# Patient Record
Sex: Female | Born: 1960
Health system: Southern US, Community
[De-identification: ages and names within clinical notes are randomized; demographics above are authoritative.]

## PROBLEM LIST (undated history)

## (undated) DIAGNOSIS — E039 Hypothyroidism, unspecified: Secondary | ICD-10-CM

## (undated) DIAGNOSIS — Z9889 Other specified postprocedural states: Secondary | ICD-10-CM

## (undated) DIAGNOSIS — Z8742 Personal history of other diseases of the female genital tract: Secondary | ICD-10-CM

## (undated) HISTORY — PX: CERVICAL POLYPECTOMY: SHX88

---

## 1999-04-12 ENCOUNTER — Other Ambulatory Visit: Admission: RE | Admit: 1999-04-12 | Discharge: 1999-04-12 | Payer: Self-pay | Admitting: Family Medicine

## 1999-05-01 ENCOUNTER — Ambulatory Visit (HOSPITAL_COMMUNITY): Admission: RE | Admit: 1999-05-01 | Discharge: 1999-05-01 | Payer: Self-pay | Admitting: Family Medicine

## 1999-05-01 ENCOUNTER — Encounter: Payer: Self-pay | Admitting: Family Medicine

## 2000-05-27 ENCOUNTER — Other Ambulatory Visit: Admission: RE | Admit: 2000-05-27 | Discharge: 2000-05-27 | Payer: Self-pay | Admitting: Family Medicine

## 2003-12-02 ENCOUNTER — Other Ambulatory Visit: Admission: RE | Admit: 2003-12-02 | Discharge: 2003-12-02 | Payer: Self-pay | Admitting: Family Medicine

## 2003-12-27 ENCOUNTER — Ambulatory Visit (HOSPITAL_COMMUNITY): Admission: RE | Admit: 2003-12-27 | Discharge: 2003-12-27 | Payer: Self-pay | Admitting: Family Medicine

## 2005-01-12 ENCOUNTER — Other Ambulatory Visit: Admission: RE | Admit: 2005-01-12 | Discharge: 2005-01-12 | Payer: Self-pay | Admitting: Family Medicine

## 2006-03-15 ENCOUNTER — Other Ambulatory Visit: Admission: RE | Admit: 2006-03-15 | Discharge: 2006-03-15 | Payer: Self-pay | Admitting: Family Medicine

## 2007-06-05 ENCOUNTER — Other Ambulatory Visit: Admission: RE | Admit: 2007-06-05 | Discharge: 2007-06-05 | Payer: Self-pay | Admitting: Family Medicine

## 2007-07-03 ENCOUNTER — Encounter: Admission: RE | Admit: 2007-07-03 | Discharge: 2007-07-03 | Payer: Self-pay | Admitting: Family Medicine

## 2008-07-06 ENCOUNTER — Ambulatory Visit (HOSPITAL_COMMUNITY): Admission: RE | Admit: 2008-07-06 | Discharge: 2008-07-06 | Payer: Self-pay | Admitting: Family Medicine

## 2008-08-26 ENCOUNTER — Other Ambulatory Visit: Admission: RE | Admit: 2008-08-26 | Discharge: 2008-08-26 | Payer: Self-pay | Admitting: Family Medicine

## 2009-07-26 ENCOUNTER — Ambulatory Visit (HOSPITAL_COMMUNITY): Admission: RE | Admit: 2009-07-26 | Discharge: 2009-07-26 | Payer: Self-pay | Admitting: Family Medicine

## 2010-01-02 ENCOUNTER — Other Ambulatory Visit: Admission: RE | Admit: 2010-01-02 | Discharge: 2010-01-02 | Payer: Self-pay | Admitting: Family Medicine

## 2010-10-26 ENCOUNTER — Ambulatory Visit (HOSPITAL_COMMUNITY): Admission: RE | Admit: 2010-10-26 | Discharge: 2010-10-26 | Payer: Self-pay | Admitting: Family Medicine

## 2011-01-04 ENCOUNTER — Other Ambulatory Visit
Admission: RE | Admit: 2011-01-04 | Discharge: 2011-01-04 | Payer: Self-pay | Source: Home / Self Care | Admitting: Family Medicine

## 2011-01-04 ENCOUNTER — Other Ambulatory Visit: Payer: Self-pay | Admitting: Family Medicine

## 2012-01-09 ENCOUNTER — Other Ambulatory Visit (HOSPITAL_COMMUNITY): Payer: Self-pay | Admitting: Family Medicine

## 2012-01-09 DIAGNOSIS — Z1231 Encounter for screening mammogram for malignant neoplasm of breast: Secondary | ICD-10-CM

## 2012-02-01 ENCOUNTER — Ambulatory Visit (HOSPITAL_COMMUNITY)
Admission: RE | Admit: 2012-02-01 | Discharge: 2012-02-01 | Disposition: A | Discharge status: Home / Self Care | Payer: Commercial Indemnity | Source: Ambulatory Visit | Attending: Family Medicine | Admitting: Family Medicine

## 2012-02-01 DIAGNOSIS — Z1231 Encounter for screening mammogram for malignant neoplasm of breast: Secondary | ICD-10-CM | POA: Insufficient documentation

## 2012-09-05 ENCOUNTER — Other Ambulatory Visit: Payer: Self-pay | Admitting: Gastroenterology

## 2013-08-25 ENCOUNTER — Other Ambulatory Visit (HOSPITAL_COMMUNITY): Payer: Self-pay | Admitting: Family Medicine

## 2013-08-25 DIAGNOSIS — Z1231 Encounter for screening mammogram for malignant neoplasm of breast: Secondary | ICD-10-CM

## 2013-08-28 ENCOUNTER — Ambulatory Visit (HOSPITAL_COMMUNITY)
Admission: RE | Admit: 2013-08-28 | Discharge: 2013-08-28 | Disposition: A | Discharge status: Home / Self Care | Payer: BC Managed Care – PPO | Source: Ambulatory Visit | Attending: Family Medicine | Admitting: Family Medicine

## 2013-08-28 DIAGNOSIS — Z1231 Encounter for screening mammogram for malignant neoplasm of breast: Secondary | ICD-10-CM | POA: Insufficient documentation

## 2013-09-01 ENCOUNTER — Other Ambulatory Visit: Payer: Self-pay | Admitting: Family Medicine

## 2013-09-01 DIAGNOSIS — R928 Other abnormal and inconclusive findings on diagnostic imaging of breast: Secondary | ICD-10-CM

## 2013-09-16 ENCOUNTER — Ambulatory Visit
Admission: RE | Admit: 2013-09-16 | Discharge: 2013-09-16 | Disposition: A | Discharge status: Home / Self Care | Payer: BC Managed Care – PPO | Source: Ambulatory Visit | Attending: Family Medicine | Admitting: Family Medicine

## 2013-09-16 DIAGNOSIS — R928 Other abnormal and inconclusive findings on diagnostic imaging of breast: Secondary | ICD-10-CM

## 2014-01-18 ENCOUNTER — Other Ambulatory Visit: Payer: Self-pay | Admitting: Family Medicine

## 2014-01-18 ENCOUNTER — Other Ambulatory Visit (HOSPITAL_COMMUNITY)
Admission: RE | Admit: 2014-01-18 | Discharge: 2014-01-18 | Disposition: A | Discharge status: Home / Self Care | Payer: BC Managed Care – PPO | Source: Ambulatory Visit | Attending: Family Medicine | Admitting: Family Medicine

## 2014-01-18 DIAGNOSIS — Z124 Encounter for screening for malignant neoplasm of cervix: Secondary | ICD-10-CM | POA: Insufficient documentation

## 2014-03-05 ENCOUNTER — Other Ambulatory Visit: Payer: Self-pay | Admitting: Obstetrics and Gynecology

## 2014-03-23 ENCOUNTER — Encounter (HOSPITAL_COMMUNITY): Payer: Self-pay | Admitting: *Deleted

## 2014-03-23 ENCOUNTER — Inpatient Hospital Stay (HOSPITAL_COMMUNITY): Payer: BC Managed Care – PPO

## 2014-03-23 ENCOUNTER — Inpatient Hospital Stay (HOSPITAL_COMMUNITY)
Admission: AD | Admit: 2014-03-23 | Discharge: 2014-03-23 | Disposition: A | Discharge status: Home / Self Care | Payer: BC Managed Care – PPO | Source: Ambulatory Visit | Attending: Obstetrics and Gynecology | Admitting: Obstetrics and Gynecology

## 2014-03-23 DIAGNOSIS — N949 Unspecified condition associated with female genital organs and menstrual cycle: Secondary | ICD-10-CM | POA: Insufficient documentation

## 2014-03-23 DIAGNOSIS — N898 Other specified noninflammatory disorders of vagina: Secondary | ICD-10-CM

## 2014-03-23 DIAGNOSIS — D259 Leiomyoma of uterus, unspecified: Secondary | ICD-10-CM | POA: Insufficient documentation

## 2014-03-23 DIAGNOSIS — E039 Hypothyroidism, unspecified: Secondary | ICD-10-CM | POA: Insufficient documentation

## 2014-03-23 DIAGNOSIS — R231 Pallor: Secondary | ICD-10-CM | POA: Insufficient documentation

## 2014-03-23 DIAGNOSIS — N938 Other specified abnormal uterine and vaginal bleeding: Secondary | ICD-10-CM | POA: Insufficient documentation

## 2014-03-23 DIAGNOSIS — N939 Abnormal uterine and vaginal bleeding, unspecified: Secondary | ICD-10-CM

## 2014-03-23 HISTORY — DX: Hypothyroidism, unspecified: E03.9

## 2014-03-23 HISTORY — DX: Personal history of other diseases of the female genital tract: Z87.42

## 2014-03-23 HISTORY — DX: Personal history of other diseases of the female genital tract: Z98.890

## 2014-03-23 LAB — CBC
HCT: 30.6 % — ABNORMAL LOW (ref 36.0–46.0)
Hemoglobin: 10.3 g/dL — ABNORMAL LOW (ref 12.0–15.0)
MCH: 21.9 pg — ABNORMAL LOW (ref 26.0–34.0)
MCHC: 33.7 g/dL (ref 30.0–36.0)
MCV: 65.1 fL — AB (ref 78.0–100.0)
PLATELETS: 157 10*3/uL (ref 150–400)
RBC: 4.7 MIL/uL (ref 3.87–5.11)
RDW: 14.7 % (ref 11.5–15.5)
WBC: 5.8 10*3/uL (ref 4.0–10.5)

## 2014-03-23 MED ORDER — NORGESTIMATE-ETH ESTRADIOL 0.25-35 MG-MCG PO TABS: 1.0000 | ORAL_TABLET | Freq: Every day | ORAL | Status: AC

## 2014-03-23 NOTE — Discharge Instructions (Signed)
Abnormal Uterine Bleeding Abnormal uterine bleeding means bleeding from the vagina that is not your normal menstrual period. This can be:  Bleeding or spotting between periods.  Bleeding after sex (sexual intercourse).  Bleeding that is heavier or more than normal.  Periods that last longer than usual.  Bleeding after menopause. There are many problems that may cause this. Treatment will depend on the cause of the bleeding. Any kind of bleeding that is not normal should be reviewed by your doctor.  HOME CARE Watch your condition for any changes. These actions may lessen any discomfort you are having:  Do not use tampons or douches as told by your doctor.  Change your pads often. You should get regular pelvic exams and Pap tests. Keep all appointments for tests as told by your doctor. GET HELP IF:  You are bleeding for more than 1 week.  You feel dizzy at times. GET HELP RIGHT AWAY IF:   You pass out.  You have to change pads every 15 to 30 minutes.  You have belly pain.  You have a fever.  You become sweaty or weak.  You are passing large blood clots from the vagina.  You feel sick to your stomach (nauseous) and throw up (vomit). MAKE SURE YOU:  Understand these instructions.  Will watch your condition.  Will get help right away if you are not doing well or get worse. Document Released: 09/30/2009 Document Revised: 09/23/2013 Document Reviewed: 07/02/2013 ExitCare Patient Information 2014 [employer].  

## 2014-03-23 NOTE — MAU Provider Note (Signed)
History     CSN: [id number]  Arrival date and time: 03/23/14 1129   First Provider Initiated Contact with Patient 03/23/14 1227      No chief complaint on file.  HPI  Ms. Heidi Nash is a 53 y.o. female [id number] who presents with vaginal bleeding. The patient had a cervical polypectomy 2 weeks ago by Dr. Simona Huh. She had very minimal bleeding following the procedure; " I had spotting off and on for 2 weeks". This morning she had a large gush of blood, along with several clots. The patient says she has continued to bleed this morning. She called the office, however Dr. Simona Huh is out of town. Patient is changing approximately 1 pad per hour; she denies pain. Pt has gone through menopause; last menstrual cycle was 4 years ago.  Patient has a history of anemia. Pt was using progesterone/estrogen up until about 2 weeks ago when she stopped.    OB History   Grav Para Term Preterm Abortions TAB SAB Ect Mult Living   2 2 2       2       Past Medical History  Diagnosis Date  . Hypothyroidism   . S/P cervical polypectomy     Past Surgical History  Procedure Laterality Date  . Cervical polypectomy      No family history on file.  History  Substance Use Topics  . Smoking status: Never Smoker   . Smokeless tobacco: Not on file  . Alcohol Use: Yes     Comment: rarelly    Allergies: No Known Allergies  Prescriptions prior to admission  Medication Sig Dispense Refill  . citalopram (CELEXA) 40 MG tablet Take 40 mg by mouth at bedtime.      . ergocalciferol (VITAMIN D2) 50000 UNITS capsule Take 50,000 Units by mouth once a week. saturdays      . Ibuprofen (ADVIL MIGRAINE) 200 MG CAPS Take 2 capsules by mouth daily as needed (for pain/headache).      Marland Kitchen levothyroxine (SYNTHROID, LEVOTHROID) 150 MCG tablet Take 150 mcg by mouth daily before breakfast.       No results found for this or any previous visit (from the past 48 hour(s)).  Results for orders placed during the hospital  encounter of 03/23/14 (from the past 48 hour(s))  CBC     Status: Abnormal   Collection Time    03/23/14 12:47 PM      Result Value Ref Range   WBC 5.8  4.0 - 10.5 K/uL   RBC 4.70  3.87 - 5.11 MIL/uL   Hemoglobin 10.3 (*) 12.0 - 15.0 g/dL   HCT 30.6 (*) 36.0 - 46.0 %   MCV 65.1 (*) 78.0 - 100.0 fL   MCH 21.9 (*) 26.0 - 34.0 pg   MCHC 33.7  30.0 - 36.0 g/dL   RDW 14.7  11.5 - 15.5 %   Platelets 157  150 - 400 K/uL   US Transvaginal Non-ob  03/23/2014   CLINICAL DATA:  Post polypectomy. Post menopause. Heavy vaginal bleeding.  EXAM: TRANSABDOMINAL AND TRANSVAGINAL ULTRASOUND OF PELVIS  TECHNIQUE: Both transabdominal and transvaginal ultrasound examinations of the pelvis were performed. Transabdominal technique was performed for global imaging of the pelvis including uterus, ovaries, adnexal regions, and pelvic cul-de-sac. It was necessary to proceed with endovaginal exam following the transabdominal exam to visualize the uterus, endometrium, ovaries and adnexa .  COMPARISON:  None  FINDINGS: Uterus  Measurements: 6.5 x 3.0 x 4.3 cm. Anterior intramural  fundal fibroid measures 16 x 15 x 11 mm. No other focal abnormality.  Endometrium  Thickness: 1-2 mm in thickness.  No focal abnormality visualized.  Right ovary  Measurements: 1.7 x 0.9 x 1.2 cm. Normal appearance/no adnexal mass.  Left ovary  Measurements: Not visualized due to overlying bowel gas. No visible adnexal masses.  Other findings  Trace free fluid in the pelvis  IMPRESSION: No endometrial abnormality.  Small intramural fundal fibroid.   Electronically Signed   By: Rolm Baptise M.D.   On: 03/23/2014 15:21   US Pelvis Complete  03/23/2014   CLINICAL DATA:  Post polypectomy. Post menopause. Heavy vaginal bleeding.  EXAM: TRANSABDOMINAL AND TRANSVAGINAL ULTRASOUND OF PELVIS  TECHNIQUE: Both transabdominal and transvaginal ultrasound examinations of the pelvis were performed. Transabdominal technique was performed for global imaging of the  pelvis including uterus, ovaries, adnexal regions, and pelvic cul-de-sac. It was necessary to proceed with endovaginal exam following the transabdominal exam to visualize the uterus, endometrium, ovaries and adnexa .  COMPARISON:  None  FINDINGS: Uterus  Measurements: 6.5 x 3.0 x 4.3 cm. Anterior intramural fundal fibroid measures 16 x 15 x 11 mm. No other focal abnormality.  Endometrium  Thickness: 1-2 mm in thickness.  No focal abnormality visualized.  Right ovary  Measurements: 1.7 x 0.9 x 1.2 cm. Normal appearance/no adnexal mass.  Left ovary  Measurements: Not visualized due to overlying bowel gas. No visible adnexal masses.  Other findings  Trace free fluid in the pelvis  IMPRESSION: No endometrial abnormality.  Small intramural fundal fibroid.   Electronically Signed   By: Rolm Baptise M.D.   On: 03/23/2014 15:21     Review of Systems  Constitutional: Positive for malaise/fatigue. Negative for fever and chills.  Cardiovascular: Negative for chest pain.  Gastrointestinal: Negative for nausea, vomiting, abdominal pain, diarrhea and constipation.  Genitourinary: Negative for dysuria, urgency, frequency and hematuria.       No vaginal discharge. + vaginal bleeding. No dysuria.   Neurological: Positive for dizziness and weakness.   Physical Exam   Blood pressure 108/59, pulse 76, temperature 98 F (36.7 C), resp. rate 18, height 5\' 3"  (1.6 m), weight 52.164 kg (115 lb).  Physical Exam  Constitutional: She is oriented to person, place, and time. She appears well-developed and well-nourished. She appears distressed.  HENT:  Head: Normocephalic.  Eyes: Pupils are equal, round, and reactive to light.  Neck: Neck supple.  GI: Soft.  Genitourinary:  Speculum exam: Golf ball size clot on perineum  Vagina - Moderate amount of bright red blood in the vaginal canal,  no odor Cervix - Moderate active bleeding from Os.  Bimanual exam: Cervix slightly open  Uterus non tender, normal  size Adnexa non tender, no masses bilaterally Chaperone present for exam.   Neurological: She is alert and oriented to person, place, and time.  Skin: There is pallor.  Psychiatric: Her behavior is normal.    MAU Course  Procedures None  MDM CBC Discussed exam and Hgb level with Dr. Landry Mellow. Pelvic US complete  Small amount of bright red blood noted on patient pad prior to discharge; pad had been on for 1 hour.  [place] report discussed with Dr. Landry Mellow.   Assessment and Plan   A:  Abnormal vaginal bleeding Fundal fibroid   P:  Discharge home in stable condition RX: Sprintec (see specific dosing) Keep your appointment with Dr. Simona Huh for next Monday. If bleeding increases patient is to call the office and schedule to  be seen earlier.  Return to MAU if symptoms worsen Bleeding precautions discussed    Oluwaseun Cremer, [place] 03/23/2014, 12:31 PM

## 2014-03-23 NOTE — MAU Note (Signed)
Pt had several cervical polyps removed 2 weeks ago by Dr Margie Billet. Pt stated she had some spotting for the past 2 weeks and then this morning passed a large clot and started bleeding heavily. Has been in menopause for 4 years. Was on progesterone patch up  Until last week when she stopped using it.

## 2014-09-16 ENCOUNTER — Other Ambulatory Visit: Payer: Self-pay

## 2014-09-16 DIAGNOSIS — Z1231 Encounter for screening mammogram for malignant neoplasm of breast: Secondary | ICD-10-CM

## 2014-10-06 ENCOUNTER — Encounter (INDEPENDENT_AMBULATORY_CARE_PROVIDER_SITE_OTHER): Payer: Self-pay

## 2014-10-06 ENCOUNTER — Ambulatory Visit
Admission: RE | Admit: 2014-10-06 | Discharge: 2014-10-06 | Disposition: A | Discharge status: Home / Self Care | Payer: BC Managed Care – PPO | Source: Ambulatory Visit

## 2014-10-06 DIAGNOSIS — Z1231 Encounter for screening mammogram for malignant neoplasm of breast: Secondary | ICD-10-CM

## 2014-10-18 ENCOUNTER — Encounter (HOSPITAL_COMMUNITY): Payer: Self-pay | Admitting: *Deleted

## 2016-01-16 ENCOUNTER — Other Ambulatory Visit: Payer: Self-pay

## 2016-01-16 DIAGNOSIS — Z1231 Encounter for screening mammogram for malignant neoplasm of breast: Secondary | ICD-10-CM

## 2016-02-03 ENCOUNTER — Ambulatory Visit: Payer: Self-pay

## 2016-02-24 ENCOUNTER — Ambulatory Visit: Payer: Self-pay

## 2016-03-09 ENCOUNTER — Ambulatory Visit: Payer: Self-pay

## 2016-08-14 ENCOUNTER — Ambulatory Visit: Payer: Self-pay

## 2016-08-31 ENCOUNTER — Ambulatory Visit
Admission: RE | Admit: 2016-08-31 | Discharge: 2016-08-31 | Disposition: A | Discharge status: Home / Self Care | Payer: 59 | Source: Ambulatory Visit

## 2016-08-31 DIAGNOSIS — Z1231 Encounter for screening mammogram for malignant neoplasm of breast: Secondary | ICD-10-CM

## 2017-03-13 DIAGNOSIS — E039 Hypothyroidism, unspecified: Secondary | ICD-10-CM | POA: Diagnosis not present

## 2017-03-13 DIAGNOSIS — Z13220 Encounter for screening for lipoid disorders: Secondary | ICD-10-CM | POA: Diagnosis not present

## 2017-04-29 DIAGNOSIS — M810 Age-related osteoporosis without current pathological fracture: Secondary | ICD-10-CM | POA: Diagnosis not present

## 2017-04-29 DIAGNOSIS — M8588 Other specified disorders of bone density and structure, other site: Secondary | ICD-10-CM | POA: Diagnosis not present

## 2017-05-02 DIAGNOSIS — E039 Hypothyroidism, unspecified: Secondary | ICD-10-CM | POA: Diagnosis not present

## 2017-05-07 DIAGNOSIS — D3141 Benign neoplasm of right ciliary body: Secondary | ICD-10-CM | POA: Diagnosis not present

## 2017-05-16 DIAGNOSIS — M810 Age-related osteoporosis without current pathological fracture: Secondary | ICD-10-CM | POA: Diagnosis not present

## 2017-07-11 ENCOUNTER — Other Ambulatory Visit: Payer: Self-pay | Admitting: Family Medicine

## 2017-07-11 ENCOUNTER — Other Ambulatory Visit (HOSPITAL_COMMUNITY)
Admission: RE | Admit: 2017-07-11 | Discharge: 2017-07-11 | Disposition: A | Discharge status: Home / Self Care | Payer: 59 | Source: Ambulatory Visit | Attending: Family Medicine | Admitting: Family Medicine

## 2017-07-11 DIAGNOSIS — Z0000 Encounter for general adult medical examination without abnormal findings: Secondary | ICD-10-CM | POA: Diagnosis not present

## 2017-07-11 DIAGNOSIS — E559 Vitamin D deficiency, unspecified: Secondary | ICD-10-CM | POA: Diagnosis not present

## 2017-07-11 DIAGNOSIS — Z23 Encounter for immunization: Secondary | ICD-10-CM | POA: Diagnosis not present

## 2017-07-11 DIAGNOSIS — Z01411 Encounter for gynecological examination (general) (routine) with abnormal findings: Secondary | ICD-10-CM | POA: Insufficient documentation

## 2017-07-11 DIAGNOSIS — E039 Hypothyroidism, unspecified: Secondary | ICD-10-CM | POA: Diagnosis not present

## 2017-07-15 LAB — CYTOLOGY - PAP: DIAGNOSIS: NEGATIVE

## 2017-10-07 DIAGNOSIS — E039 Hypothyroidism, unspecified: Secondary | ICD-10-CM | POA: Diagnosis not present

## 2017-12-20 ENCOUNTER — Other Ambulatory Visit: Payer: Self-pay | Admitting: Family Medicine

## 2017-12-20 DIAGNOSIS — Z1231 Encounter for screening mammogram for malignant neoplasm of breast: Secondary | ICD-10-CM

## 2017-12-30 DIAGNOSIS — L820 Inflamed seborrheic keratosis: Secondary | ICD-10-CM | POA: Diagnosis not present

## 2018-01-09 ENCOUNTER — Ambulatory Visit
Admission: RE | Admit: 2018-01-09 | Discharge: 2018-01-09 | Disposition: A | Discharge status: Home / Self Care | Payer: 59 | Source: Ambulatory Visit | Attending: Family Medicine | Admitting: Family Medicine

## 2018-01-09 DIAGNOSIS — Z1231 Encounter for screening mammogram for malignant neoplasm of breast: Secondary | ICD-10-CM | POA: Diagnosis not present

## 2018-01-13 DIAGNOSIS — E039 Hypothyroidism, unspecified: Secondary | ICD-10-CM | POA: Diagnosis not present

## 2018-01-13 DIAGNOSIS — E559 Vitamin D deficiency, unspecified: Secondary | ICD-10-CM | POA: Diagnosis not present

## 2018-05-02 DIAGNOSIS — M25572 Pain in left ankle and joints of left foot: Secondary | ICD-10-CM | POA: Diagnosis not present

## 2018-07-16 DIAGNOSIS — Z13220 Encounter for screening for lipoid disorders: Secondary | ICD-10-CM | POA: Diagnosis not present

## 2018-07-16 DIAGNOSIS — E559 Vitamin D deficiency, unspecified: Secondary | ICD-10-CM | POA: Diagnosis not present

## 2018-07-16 DIAGNOSIS — Z0000 Encounter for general adult medical examination without abnormal findings: Secondary | ICD-10-CM | POA: Diagnosis not present

## 2018-07-16 DIAGNOSIS — E039 Hypothyroidism, unspecified: Secondary | ICD-10-CM | POA: Diagnosis not present

## 2018-08-28 DIAGNOSIS — E039 Hypothyroidism, unspecified: Secondary | ICD-10-CM | POA: Diagnosis not present

## 2018-12-05 DIAGNOSIS — H1045 Other chronic allergic conjunctivitis: Secondary | ICD-10-CM | POA: Diagnosis not present

## 2019-01-19 DIAGNOSIS — M542 Cervicalgia: Secondary | ICD-10-CM | POA: Diagnosis not present

## 2019-01-19 DIAGNOSIS — E039 Hypothyroidism, unspecified: Secondary | ICD-10-CM | POA: Diagnosis not present

## 2019-02-16 ENCOUNTER — Other Ambulatory Visit: Payer: Self-pay

## 2019-02-16 DIAGNOSIS — Z1231 Encounter for screening mammogram for malignant neoplasm of breast: Secondary | ICD-10-CM

## 2019-03-13 ENCOUNTER — Ambulatory Visit: Payer: 59

## 2019-08-05 ENCOUNTER — Ambulatory Visit: Payer: 59 | Attending: Family Medicine | Admitting: Physical Therapy

## 2019-08-05 ENCOUNTER — Encounter: Payer: Self-pay | Admitting: Physical Therapy

## 2019-08-05 ENCOUNTER — Other Ambulatory Visit: Payer: Self-pay

## 2019-08-05 DIAGNOSIS — R262 Difficulty in walking, not elsewhere classified: Secondary | ICD-10-CM | POA: Insufficient documentation

## 2019-08-05 DIAGNOSIS — M25551 Pain in right hip: Secondary | ICD-10-CM | POA: Insufficient documentation

## 2019-08-05 DIAGNOSIS — M6281 Muscle weakness (generalized): Secondary | ICD-10-CM | POA: Diagnosis present

## 2019-08-05 NOTE — Therapy (Signed)
[facility] [address], [place], [place], [address] Phone: [phone]   Fax:  [phone]  Physical Therapy Evaluation  Patient Details  Name: Heidi Nash MRN: [id number] Date of Birth: 05-20-1961 Referring Provider (PT): Sela Hilding MD   Encounter Date: 08/05/2019  PT End of Session - 08/05/19 1623    Visit Number  1    Number of Visits  13    Date for PT Re-Evaluation  09/30/19    Authorization Type  UHC    PT Start Time  1330    PT Stop Time  1425    PT Time Calculation (min)  55 min    Activity Tolerance  Patient tolerated treatment well    Behavior During Therapy  [facility] for tasks assessed/performed       Past Medical History:  Diagnosis Date  . Hypothyroidism   . S/P cervical polypectomy     Past Surgical History:  Procedure Laterality Date  . CERVICAL POLYPECTOMY      There were no vitals filed for this visit.   Subjective Assessment - 08/05/19 1331    Subjective  Pt arriving to therapy reporting history of R buttock pain which radiates down R posterior thigh. 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 Pt reporting 3/10 pain today in R buttock. Pt with referral diagnosis of R piriformis syndrom. Pt reports she is taking ibprophen daily to help with pain. Pt also reporting her pain varies.    Pertinent History  Osteoporoais, hypothyroidism, s/p cervical polysectomy    Limitations  Sitting    How long can you sit comfortably?  20 minutes    How long can you walk comfortably?  unlimited    Patient Stated Goals  Stop hurting,    Currently in Pain?  Yes    Pain Score  3     Pain Location  Buttocks    Pain Orientation  Right    Pain Descriptors / Indicators  Aching    Pain Type  Acute pain    Pain Onset  More than a month ago    Pain Frequency  Intermittent    Aggravating Factors    sitting prolonged    Pain Relieving Factors  lying down         [facility] PT Assessment - 08/05/19 0001      Assessment   Medical Diagnosis  R piriformis syndrome    Referring Provider (PT)  Sela Hilding MD    Hand Dominance  Right    Prior Therapy  no      Precautions   Precautions  None      Restrictions   Weight Bearing Restrictions  No      Balance Screen   Has the patient fallen in the past 6 months  No    Is the patient reluctant to leave their home because of a fear of falling?   No      Home Film/video editor residence      Prior Function   Level of Independence  Independent    Vocation  Part time employment    Vocation Requirements  teaches piano lessons      Cognition   Overall Cognitive Status  Within Functional Limits for tasks assessed      Observation/Other Assessments   Focus on Therapeutic Outcomes (  FOTO)   23% limitation      Observation/Other Assessments-Edema    Edema  --   mild edema noted in bilateral ankles     Posture/Postural Control   Posture/Postural Control  Postural limitations    Postural Limitations  Rounded Shoulders;Forward head;Increased thoracic kyphosis    Posture Comments  pt instructed in posture correction and rows added to pt's HEP      ROM / Strength   AROM / PROM / Strength  Strength      Strength   Overall Strength Comments  bilateral hips were grossly -4/5      Flexibility   Soft Tissue Assessment /Muscle Length  yes    Hamstrings  R: 70 degrees, L 75 degrees      Palpation   Palpation comment  TTP R piriformis and R lumbar paraspinals      Special Tests    Special Tests  Lumbar    Other special tests  --   negative SLR bilaterally     Ambulation/Gait   Gait Comments  WFL                Objective measurements completed on examination: See above findings.              PT Education - 08/05/19 1337    Education Details  HEP, posture    Person(s) Educated   Patient    Methods  Explanation;Demonstration;Handout    Comprehension  Verbalized understanding;Returned demonstration          PT Long Term Goals - 08/05/19 1632      PT LONG TERM GOAL #1   Title  Pt will be independent in her HEP and progression.    Time  6    Period  Weeks    Status  New    Target Date  09/16/19      PT LONG TERM GOAL #2   Title  Pt will improve her FOTO score from 23% limitaiton to </=18 % limitation.    Baseline  23% limitiation    Time  6    Period  Weeks    Status  New    Target Date  09/16/19      PT LONG TERM GOAL #3   Title  Pt will be able to improve her LE hip strength to grossly 5/5 in order to improve gait and function.    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    Baseline  pt reporting pain varies and can increase to 6/10.    Time  6    Period  Weeks    Status  New             Plan - 08/05/19 1624    Clinical Impression Statement  Pt presenting today with history of R hip pain/buttock pain since April 2020. Pt reporting since COVID she has been working from home as a English as a second language teacher doing virtual lessions sitting on a wooden chair. Pt reporting she has been trying yoga but it doesn't seem to help her buttock pain. Pt tender to palpation of her R piriformis and lumbar paraspinals. Pt with limited hamstring  flexibility. Pt reporting her pain extends down the backof her R thigh at times and her pain can vary depending on ther activities and the day. Pt reporting 3/10 pain at rest today. Pt with mild weakness in bilateral hips of -4/5, knees grossly 5/5. Pt was instructed in HEP and pt reported after session that her pain improved to 1/10. Pt could benefit from skilled PT to address pt's impairments with the below interventions.    Personal Factors  and Comorbidities  Comorbidity 2    Comorbidities  osteoporosis, hypothyroidism    Examination-Activity Limitations  Squat;Sit    Examination-Participation Restrictions  Community Activity;Other    Stability/Clinical Decision Making  Evolving/Moderate complexity    Clinical Decision Making  Moderate   due to radiation down R LE   Rehab Potential  Good    PT Frequency  2x / week    PT Duration  6 weeks    PT Treatment/Interventions  Electrical Stimulation;Cryotherapy;Moist Heat;Traction;Functional mobility training;Stair training;Therapeutic activities;Therapeutic exercise;Balance training;Patient/family education;Manual techniques;Passive range of motion;Dry needling;Taping    PT Next Visit Plan  hip and lumbar stretching, postural exercises, core and hip strengthening    PT Home Exercise Plan  [email or web address]    Consulted and Agree with Plan of Care  Patient       Patient will benefit from skilled therapeutic intervention in order to improve the following deficits and impairments:  Pain, Postural dysfunction, Impaired flexibility, Decreased strength  Visit Diagnosis: 1. Pain in right hip   2. Difficulty in walking, not elsewhere classified   3. Muscle weakness (generalized)        Problem List There are no active problems to display for this patient.   Oretha Caprice, PT 08/05/2019, 4:43 PM  [facility] [address], [place], [place], [address] Phone: [phone]   Fax:  [phone]  Name: Waneta Fitting MRN: [id number] Date of Birth: [birth year]

## 2019-08-11 ENCOUNTER — Other Ambulatory Visit: Payer: Self-pay

## 2019-08-11 ENCOUNTER — Ambulatory Visit: Payer: 59 | Admitting: Physical Therapy

## 2019-08-11 DIAGNOSIS — M25551 Pain in right hip: Secondary | ICD-10-CM

## 2019-08-11 DIAGNOSIS — M6281 Muscle weakness (generalized): Secondary | ICD-10-CM

## 2019-08-11 DIAGNOSIS — R262 Difficulty in walking, not elsewhere classified: Secondary | ICD-10-CM

## 2019-08-11 NOTE — Therapy (Signed)
[facility] [address] [place], [place], [address] Phone: [phone]   Fax:  [phone]  Physical Therapy Treatment  Patient Details  Name: Heidi Nash MRN: [id number] Date of Birth: [birth year] Referring Provider (PT): Sela Hilding MD   Encounter Date: 08/11/2019  PT End of Session - 08/11/19 1556    Visit Number  2    Date for PT Re-Evaluation  09/30/19    Authorization Type  UHC    PT Start Time  1402    PT Stop Time  1441    PT Time Calculation (min)  39 min    Activity Tolerance  Patient tolerated treatment well    Behavior During Therapy  [facility] for tasks assessed/performed       Past Medical History:  Diagnosis Date  . Hypothyroidism   . S/P cervical polypectomy     Past Surgical History:  Procedure Laterality Date  . CERVICAL POLYPECTOMY      There were no vitals filed for this visit.  Subjective Assessment - 08/11/19 1404    Subjective  Pt states she is having pain in the Rt gluteal region.  She states she has been doing the stretches and doesn't feel any changes yet.    Pertinent History  Osteoporoais, hypothyroidism, s/p cervical polysectomy    Limitations  Sitting    How long can you sit comfortably?  20 minutes    Currently in Pain?  Yes    Pain Score  4     Pain Location  Buttocks    Pain Orientation  Right    Pain Descriptors / Indicators  Aching    Pain Type  Acute pain    Pain Onset  More than a month ago    Pain Frequency  Intermittent    Aggravating Factors   depends on how I am sitting    Multiple Pain Sites  No                       [facility] Adult PT Treatment/Exercise - 08/11/19 0001      Exercises   Exercises  Knee/Hip      Knee/Hip Exercises: Stretches   Piriformis Stretch  Right;Left;3 reps    Piriformis Stretch Limitations  hip internal rotation stretch in supine - 3 x 10 sec    Other Knee/Hip Stretches  throracic extension and pec stretch  with towel roll in supine - 3 x 20 sec    Other Knee/Hip Stretches  sidelying thoracic rotation - 3 x 10 sec bilat      Knee/Hip Exercises: Aerobic   Nustep  L1 x 5 min warm up - seat 7   PT present to discuss treatment     Knee/Hip Exercises: Standing   Other Standing Knee Exercises  repeated flexion - pt reported feeling better after performing flexion stretches      Manual Therapy   Manual Therapy  Soft tissue mobilization    Soft tissue mobilization  Rt gluteals, hamstring; bilat lumbar and throracic paraspinals                  PT Long Term Goals - 08/05/19 1632      PT LONG TERM GOAL #1   Title  Pt will be independent in her HEP and progression.    Time  6    Period  Weeks    Status  New    Target Date  09/16/19  PT LONG TERM GOAL #2   Title  Pt will improve her FOTO score from 23% limitaiton to </=18 % limitation.    Baseline  23% limitiation    Time  6    Period  Weeks    Status  New    Target Date  09/16/19      PT LONG TERM GOAL #3   Title  Pt will be able to improve her LE hip strength to grossly 5/5 in order to improve gait and function.    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    Baseline  pt reporting pain varies and can increase to 6/10.    Time  6    Period  Weeks    Status  New            Plan - 08/11/19 1445    Clinical Impression Statement  This was patient's first treatment since eval.  She had significant tension throughout the posterior kinetic chain including hamstrings, glutes, and paraspinals.  Pt was educated on thoracic extesnion stretces in order to bring center of mass more posterior for less strain on back erectors and glutes.  Pt will continue to benefit from skilled PT to improve posture and reduce muscle tension.    PT  Treatment/Interventions  Electrical Stimulation;Cryotherapy;Moist Heat;Traction;Functional mobility training;Stair training;Therapeutic activities;Therapeutic exercise;Balance training;Patient/family education;Manual techniques;Passive range of motion;Dry needling;Taping    PT Next Visit Plan  f/u on thoracic extension stretch added to HEP, hamstring stretches, hip and lumbar stretching, postural exercises, core and hip strengthening    PT Home Exercise Plan  [email or web address]    Consulted and Agree with Plan of Care  Patient       Patient will benefit from skilled therapeutic intervention in order to improve the following deficits and impairments:  Pain, Postural dysfunction, Impaired flexibility, Decreased strength  Visit Diagnosis: Pain in right hip  Difficulty in walking, not elsewhere classified  Muscle weakness (generalized)     Problem List There are no active problems to display for this patient.   Jule Ser, PT 08/11/2019, 4:02 PM  [facility] [address] [place], [place], [address] Phone: [phone]   Fax:  [phone]  Name: Heidi Nash MRN: [id number] Date of Birth: 05/17/1961

## 2019-08-13 ENCOUNTER — Ambulatory Visit: Payer: 59 | Admitting: Physical Therapy

## 2019-08-14 ENCOUNTER — Encounter

## 2019-08-18 ENCOUNTER — Ambulatory Visit: Payer: 59 | Attending: Family Medicine | Admitting: Physical Therapy

## 2019-08-18 ENCOUNTER — Other Ambulatory Visit: Payer: Self-pay

## 2019-08-18 DIAGNOSIS — M25551 Pain in right hip: Secondary | ICD-10-CM | POA: Insufficient documentation

## 2019-08-18 DIAGNOSIS — R262 Difficulty in walking, not elsewhere classified: Secondary | ICD-10-CM | POA: Insufficient documentation

## 2019-08-18 DIAGNOSIS — M6281 Muscle weakness (generalized): Secondary | ICD-10-CM | POA: Insufficient documentation

## 2019-08-18 NOTE — Patient Instructions (Addendum)
Trigger Point Dry Needling  . What is Trigger Point Dry Needling (DN)? o DN is a physical therapy technique used to treat muscle pain and dysfunction. Specifically, DN helps deactivate muscle trigger points (muscle knots).  o A thin filiform needle is used to penetrate the skin and stimulate the underlying trigger point. The goal is for a local twitch response (LTR) to occur and for the trigger point to relax. No medication of any kind is injected during the procedure.   . What Does Trigger Point Dry Needling Feel Like?  o The procedure feels different for each individual patient. Some patients report that they do not actually feel the needle enter the skin and overall the process is not painful. Very mild bleeding may occur. However, many patients feel a deep cramping in the muscle in which the needle was inserted. This is the local twitch response.   Marland Kitchen How Will I feel after the treatment? o Soreness is normal, and the onset of soreness may not occur for a few hours. Typically this soreness does not last longer than two days.  o Bruising is uncommon, however; ice can be used to decrease any possible bruising.  o In rare cases feeling tired or nauseous after the treatment is normal. In addition, your symptoms may get worse before they get better, this period will typically not last longer than 24 hours.   . What Can I do After My Treatment? o Increase your hydration by drinking more water for the next 24 hours. o You may place ice or heat on the areas treated that have become sore, however, do not use heat on inflamed or bruised areas. Heat often brings more relief post needling. o You can continue your regular activities, but vigorous activity is not recommended initially after the treatment for 24 hours. o DN is best combined with other physical therapy such as strengthening, stretching, and other therapies.     Ruben Im PT [facility] [address], [place], [place] [address] Phone # [phone] Fax [phone]   Access Code: [email or web address]  URL: [email or web address]  Date: 08/18/2019  Prepared by: Ruben Im   Exercises  Supine Piriformis Stretch - 5 reps - 1 sets - 30-60 seconds hold - 3x daily - 7x weekly  Supine Hamstring Stretch with Strap - 5 reps - 1 sets - 30-60 seconds hold - 3x daily - 7x weekly  Standing Row with Anchored Resistance - 10 reps - 2 sets - 3-5 seconds hold - 3x daily - 7x weekly  Sidelying Hip Abduction - 10 reps - 2 sets - 2-3 seconds hold - 3x daily - 7x weekly  Supine Single Knee to Chest Stretch - 3 reps - 30 seconds hold - 3x daily - 7x weekly  Thoracic Extension Mobilization with Noodle - 10 reps - 3 sets - 1x daily - 7x weekly  Prone Hip Extension - One Pillow - 10 reps - 1 sets - 1x daily - 7x weekly

## 2019-08-18 NOTE — Therapy (Signed)
[facility] [address], [place], [place], [address] Phone: [phone]   Fax:  [phone]  Physical Therapy Treatment  Patient Details  Name: Heidi Nash MRN: [id number] Date of Birth: 03-24-1961 Referring Provider (PT): Sela Hilding MD   Encounter Date: 08/18/2019  PT End of Session - 08/18/19 1928    Visit Number  3    Number of Visits  13    Date for PT Re-Evaluation  09/30/19    Authorization Type  U[employer]    PT Start Time  1130    PT Stop Time  1215    PT Time Calculation (min)  45 min    Activity Tolerance  Patient tolerated treatment well       Past Medical History:  Diagnosis Date  . Hypothyroidism   . S/P cervical polypectomy     Past Surgical History:  Procedure Laterality Date  . CERVICAL POLYPECTOMY      There were no vitals filed for this visit.  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  Still hurts to sit especially slouched.  No pain standing/walking.  Reports good compliance with HEP.    Currently in Pain?  No/denies    Pain Score  0-No pain    Pain Location  Buttocks    Pain Orientation  Right    Aggravating Factors   sitting    Pain Relieving Factors  walking, standing                       [facility] Adult PT Treatment/Exercise - 08/18/19 0001      Self-Care   Posture  demonstrate use of lumbar roll, sitting limitation;  avoid sacral sitting       Knee/Hip Exercises: Stretches   Piriformis Stretch  Right;Left;3 reps    Piriformis Stretch Limitations  supine       Knee/Hip Exercises: Prone   Hip Extension  Strengthening;Right;Left    Hip Extension Limitations  over 1 pillow       Moist Heat Therapy   Number Minutes Moist Heat  5 Minutes    Moist Heat Location  Hip      Manual Therapy   Joint Mobilization  right long axis distraction, inferior, and AP in internal rotation 3x 30 sec  grade 3    Soft tissue mobilization  right piriformis, gluteals    Muscle Energy Technique  piriformis contract relax 3x 5 sec        Trigger Point Dry Needling - 08/18/19 0001    Consent Given?  Yes    Education Handout Provided  Yes    Muscles Treated Back/Hip  Gluteus minimus;Gluteus medius;Gluteus maximus;Piriformis    Dry Needling Comments  right    Gluteus Minimus Response  Twitch response elicited;Palpable increased muscle length    Gluteus Medius Response  Twitch response elicited;Palpable increased muscle length    Gluteus Maximus Response  Twitch response elicited;Palpable increased muscle length    Piriformis Response  Twitch response elicited;Palpable increased muscle length   medial and lateral border           PT Education - 08/18/19 1927    Education Details  dry needling after care;  prone over 1 pillow hip extension    Person(s) Educated  Patient    Methods  Explanation;Demonstration;Handout    Comprehension  Returned demonstration;Verbalized understanding  PT Long Term Goals - 08/05/19 1632      PT LONG TERM GOAL #1   Title  Pt will be independent in her HEP and progression.    Time  6    Period  Weeks    Status  New    Target Date  09/16/19      PT LONG TERM GOAL #2   Title  Pt will improve her FOTO score from 23% limitaiton to </=18 % limitation.    Baseline  23% limitiation    Time  6    Period  Weeks    Status  New    Target Date  09/16/19      PT LONG TERM GOAL #3   Title  Pt will be able to improve her LE hip strength to grossly 5/5 in order to improve gait and function.    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    Baseline  pt reporting pain varies and can increase to 6/10.    Time  6    Period  Weeks    Status  New             Plan - 08/18/19 1207    Clinical Impression Statement  The patient has tender points particularly medial attachment of piriformis but also in gluteals and lateral piriformis.  She is receptive to trying DN in conjunction with exercise and other manual techniques.  She demonstrates improved soft tissue length following treatment session and pain relief.  Receptive to postural education and demonstrates good carryover with previous HEP and initiation of gluteal strengthening.    Rehab Potential  Good    PT Frequency  2x / week    PT Duration  6 weeks    PT Treatment/Interventions  Electrical Stimulation;Cryotherapy;Moist Heat;Traction;Functional mobility training;Stair training;Therapeutic activities;Therapeutic exercise;Balance training;Patient/family education;Manual techniques;Passive range of motion;Dry needling;Taping    PT Next Visit Plan  assess response to DN #1;  manual techniques;  add bird dogs and clams, stand tall glute med to [place]       Patient will benefit from skilled therapeutic intervention in order to improve the following deficits and impairments:  Pain, Postural dysfunction, Impaired flexibility, Decreased strength  Visit Diagnosis: Pain in right hip  Difficulty in walking, not elsewhere classified  Muscle weakness (generalized)     Problem List There are no active problems to display for this patient.  Ruben Im, PT 08/18/19 7:35 PM Phone: [phone] Fax: [phone]  [name] 08/18/2019, 7:35 PM  [facility] [address] [place], [place], [address] Phone: [phone]   Fax:  [phone]  Name: Azani Stockard MRN: [id number] Date of Birth: [birth year]

## 2019-08-21 ENCOUNTER — Ambulatory Visit: Payer: 59 | Admitting: Physical Therapy

## 2019-09-01 ENCOUNTER — Other Ambulatory Visit: Payer: Self-pay

## 2019-09-01 ENCOUNTER — Encounter: Payer: Self-pay | Admitting: Physical Therapy

## 2019-09-01 ENCOUNTER — Ambulatory Visit: Payer: 59 | Admitting: Physical Therapy

## 2019-09-01 DIAGNOSIS — M25551 Pain in right hip: Secondary | ICD-10-CM | POA: Diagnosis not present

## 2019-09-01 DIAGNOSIS — R262 Difficulty in walking, not elsewhere classified: Secondary | ICD-10-CM

## 2019-09-01 DIAGNOSIS — M6281 Muscle weakness (generalized): Secondary | ICD-10-CM

## 2019-09-01 NOTE — Patient Instructions (Signed)
Access Code: [email or web address]  URL: [email or web address]  Date: 09/01/2019  Prepared by: Ruben Im   Exercises  Supine Piriformis Stretch - 5 reps - 1 sets - 30-60 seconds hold - 3x daily - 7x weekly  Supine Hamstring Stretch with Strap - 5 reps - 1 sets - 30-60 seconds hold - 3x daily - 7x weekly  Standing Row with Anchored Resistance - 10 reps - 2 sets - 3-5 seconds hold - 3x daily - 7x weekly  Sidelying Hip Abduction - 10 reps - 2 sets - 2-3 seconds hold - 3x daily - 7x weekly  Supine Single Knee to Chest Stretch - 3 reps - 30 seconds hold - 3x daily - 7x weekly  Thoracic Extension Mobilization with Noodle - 10 reps - 3 sets - 1x daily - 7x weekly  Prone Hip Extension - One Pillow - 10 reps - 1 sets - 1x daily - 7x weekly  Single Leg Stance - 10 reps - 1 sets - 1x daily - 7x weekly  Bird Dog - 10 reps - 1 sets - 1x daily - 7x weekly  Clamshell - 10 reps - 1 sets - 1x daily - 7x weekly

## 2019-09-01 NOTE — Therapy (Signed)
[facility] [address] [place], [place], [address] Phone: [phone]   Fax:  [phone]  Physical Therapy Treatment  Patient Details  Name: Heidi Nash MRN: [id number] Date of Birth: [birth year] Referring Provider (PT): Sela Hilding MD   Encounter Date: 09/01/2019  PT End of Session - 09/01/19 1420    Visit Number  4    Number of Visits  13    Date for PT Re-Evaluation  09/30/19    Authorization Type  UHC    PT Start Time  1048    PT Stop Time  1135    PT Time Calculation (min)  47 min    Activity Tolerance  Patient tolerated treatment well       Past Medical History:  Diagnosis Date  . Hypothyroidism   . S/P cervical polypectomy     Past Surgical History:  Procedure Laterality Date  . CERVICAL POLYPECTOMY      There were no vitals filed for this visit.  Subjective Assessment - 09/01/19 1052    Subjective  Felt great for the whole day but it came back.  The pain is not as bad now.  Walking regularly and doing the ex's 2x/day.    Currently in Pain?  Yes    Pain Score  4     Pain Location  Hip    Aggravating Factors   sitting                       [facility] Adult PT Treatment/Exercise - 09/01/19 0001      Lumbar Exercises: Quadruped   Opposite Arm/Leg Raise  Right arm/Left leg;Left arm/Right leg;10 reps      Knee/Hip Exercises: Aerobic   Nustep  L2 3 min while discussing status/progress       Knee/Hip Exercises: Standing   Other Standing Knee Exercises  stand tall ex 10x right/left       Knee/Hip Exercises: Sidelying   Hip ABduction  Strengthening;Right;10 reps    Hip ABduction Limitations  to correct technique     Clams  15x right/left       Moist Heat Therapy   Number Minutes Moist Heat  5 Minutes    Moist Heat Location  Hip      Manual Therapy   Joint Mobilization  right long axis distraction, inferior, and AP in internal rotation 3x 30 sec grade 3    Soft  tissue mobilization  right piriformis, gluteals       Trigger Point Dry Needling - 09/01/19 0001    Consent Given?  Yes    Dry Needling Comments  right    Gluteus Minimus Response  Twitch response elicited;Palpable increased muscle length    Gluteus Medius Response  Twitch response elicited;Palpable increased muscle length    Gluteus Maximus Response  Twitch response elicited;Palpable increased muscle length    Piriformis Response  Twitch response elicited;Palpable increased muscle length   medial and lateral border           PT Education - 09/01/19 1420    Education Details  Access Code: [email or web address]   stand tall;  bird dogs, clams no resistance    Person(s) Educated  Patient    Methods  Explanation;Demonstration;Handout    Comprehension  Returned demonstration;Verbalized understanding          PT Long Term Goals - 08/05/19 1632      PT LONG TERM GOAL #1   Title  Pt will be independent in her HEP and progression.    Time  6    Period  Weeks    Status  New    Target Date  09/16/19      PT LONG TERM GOAL #2   Title  Pt will improve her FOTO score from 23% limitaiton to </=18 % limitation.    Baseline  23% limitiation    Time  6    Period  Weeks    Status  New    Target Date  09/16/19      PT LONG TERM GOAL #3   Title  Pt will be able to improve her LE hip strength to grossly 5/5 in order to improve gait and function.    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    Baseline  pt reporting pain varies and can increase to 6/10.    Time  6    Period  Weeks    Status  New            Plan - 09/01/19 1125    Clinical Impression Statement  The patient is receptive to progression of glute medius muscle strengthening with verbal and tactile cues to decrease compensatory  muscles. Decreased overall tender point size and number but has taut bands in medial and lateral piriformis and gluteals.  Therapist closely monitoring response throughout session.  She reports she is painfree end of session.    Comorbidities  osteoporosis, hypothyroidism    PT Frequency  2x / week    PT Duration  6 weeks    PT Treatment/Interventions  Electrical Stimulation;Cryotherapy;Moist Heat;Traction;Functional mobility training;Stair training;Therapeutic activities;Therapeutic exercise;Balance training;Patient/family education;Manual techniques;Passive range of motion;Dry needling;Taping    PT Next Visit Plan  assess response to DN #2;  manual techniques;  glute medius ex;  try standing band hip abduction and extension, lateral step ups    PT Home Exercise Plan  [email or web address]       Patient will benefit from skilled therapeutic intervention in order to improve the following deficits and impairments:  Pain, Postural dysfunction, Impaired flexibility, Decreased strength  Visit Diagnosis: Pain in right hip  Difficulty in walking, not elsewhere classified  Muscle weakness (generalized)     Problem List There are no active problems to display for this patient.  Ruben Im, PT 09/01/19 2:27 PM Phone: [phone] Fax: [phone]  [name] 09/01/2019, 2:26 PM  [facility] [address], [place], [place], [address] Phone: [phone]   Fax:  [phone]  Name: Heidi Nash MRN: [id number] Date of Birth: [birth year]

## 2019-09-08 ENCOUNTER — Ambulatory Visit: Payer: 59 | Admitting: Physical Therapy

## 2019-09-08 ENCOUNTER — Other Ambulatory Visit: Payer: Self-pay

## 2019-09-08 DIAGNOSIS — M25551 Pain in right hip: Secondary | ICD-10-CM

## 2019-09-08 DIAGNOSIS — M6281 Muscle weakness (generalized): Secondary | ICD-10-CM

## 2019-09-08 DIAGNOSIS — R262 Difficulty in walking, not elsewhere classified: Secondary | ICD-10-CM

## 2019-09-08 NOTE — Therapy (Signed)
[facility] [address] [place], [place], [address] Phone: [phone]   Fax:  [phone]  Physical Therapy Treatment  Patient Details  Name: Heidi Nash MRN: [id number] Date of Birth: 1961-09-09 Referring Provider (PT): Sela Hilding MD   Encounter Date: 09/08/2019  PT End of Session - 09/08/19 1337    Visit Number  5    Date for PT Re-Evaluation  09/30/19    Authorization Type  UHC    PT Start Time  1216    PT Stop Time  1305    PT Time Calculation (min)  49 min    Activity Tolerance  Patient tolerated treatment well       Past Medical History:  Diagnosis Date  . Hypothyroidism   . S/P cervical polypectomy     Past Surgical History:  Procedure Laterality Date  . CERVICAL POLYPECTOMY      There were no vitals filed for this visit.  Subjective Assessment - 09/08/19 1217    Subjective  Good weekend.  About the same.  The more active I am the better it is.  Walked this morning.  I felt it driving to the park this morning.    Pertinent History  Osteoporoais, hypothyroidism, s/p cervical polysectomy    Currently in Pain?  No/denies    Pain Score  0-No pain    Pain Location  Hip    Pain Orientation  Right                       [facility] Adult PT Treatment/Exercise - 09/08/19 0001      Lumbar Exercises: Supine   Ab Set  10 reps    Bent Knee Raise  10 reps    Isometric Hip Flexion  10 reps      Knee/Hip Exercises: Stretches   Hip Flexor Stretch  Right;Left;5 reps    Hip Flexor Stretch Limitations  2nd step with UE raise      Knee/Hip Exercises: Standing   Other Standing Knee Exercises  SLS with green band UE diagonals in opposite hand 15x right/left     Other Standing Knee Exercises  green band shoulder extensions 15x       Knee/Hip Exercises: Sidelying   Clams  15x right with red band     Other Sidelying Knee/Hip Exercises  30 sec clam isometric hold       Moist Heat  Therapy   Number Minutes Moist Heat  5 Minutes    Moist Heat Location  Hip      Manual Therapy   Soft tissue mobilization  right piriformis, gluteals       Trigger Point Dry Needling - 09/08/19 0001    Consent Given?  Yes    Dry Needling Comments  right    Gluteus Minimus Response  Twitch response elicited;Palpable increased muscle length    Gluteus Medius Response  Twitch response elicited;Palpable increased muscle length    Gluteus Maximus Response  Twitch response elicited;Palpable increased muscle length    Piriformis Response  Twitch response elicited;Palpable increased muscle length   medial and lateral border                PT Long Term Goals - 08/05/19 1632      PT LONG TERM GOAL #1   Title  Pt will be independent in her HEP and progression.    Time  6    Period  Weeks  Status  New    Target Date  09/16/19      PT LONG TERM GOAL #2   Title  Pt will improve her FOTO score from 23% limitaiton to </=18 % limitation.    Baseline  23% limitiation    Time  6    Period  Weeks    Status  New    Target Date  09/16/19      PT LONG TERM GOAL #3   Title  Pt will be able to improve her LE hip strength to grossly 5/5 in order to improve gait and function.    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    Baseline  pt reporting pain varies and can increase to 6/10.    Time  6    Period  Weeks    Status  New            Plan - 09/08/19 1258    Clinical Impression Statement  The patient is receptive to instruction in transverse abdominus recruitment "core" exercises.  She is able to progress glute medius specific strengthening to include standing single leg and resisted sidelying band.  She reports muscular fatigue appropriate to the level of difficulty of ex.  Fewer  overall tender points noted with manual assessment.  Anticipate she will meet remaining goals in 2-3 visits.    Comorbidities  osteoporosis, hypothyroidism    Examination-Activity Limitations  Squat;Sit    Examination-Participation Restrictions  Community Activity;Other    Rehab Potential  Good    PT Frequency  2x / week    PT Duration  6 weeks    PT Treatment/Interventions  Electrical Stimulation;Cryotherapy;Moist Heat;Traction;Functional mobility training;Stair training;Therapeutic activities;Therapeutic exercise;Balance training;Patient/family education;Manual techniques;Passive range of motion;Dry needling;Taping    PT Next Visit Plan  check progress toward goals;  FOTO; MMT;  glute medius ex;  DN as needed    PT Home Exercise Plan  [email or web address]       Patient will benefit from skilled therapeutic intervention in order to improve the following deficits and impairments:  Pain, Postural dysfunction, Impaired flexibility, Decreased strength  Visit Diagnosis: Pain in right hip  Difficulty in walking, not elsewhere classified  Muscle weakness (generalized)     Problem List There are no active problems to display for this patient.  Ruben Im, PT 09/08/19 2:48 PM Phone: [phone] Fax: [phone]  [name] 09/08/2019, 2:48 PM  [facility] [address], [place], [place], [address] Phone: [phone]   Fax:  [phone]  Name: Tamia Rana MRN: [id number] Date of Birth: 1961-11-13

## 2019-09-15 ENCOUNTER — Ambulatory Visit: Payer: 59 | Admitting: Physical Therapy

## 2019-09-15 ENCOUNTER — Other Ambulatory Visit: Payer: Self-pay

## 2019-09-15 DIAGNOSIS — R262 Difficulty in walking, not elsewhere classified: Secondary | ICD-10-CM

## 2019-09-15 DIAGNOSIS — M6281 Muscle weakness (generalized): Secondary | ICD-10-CM

## 2019-09-15 DIAGNOSIS — M25551 Pain in right hip: Secondary | ICD-10-CM

## 2019-09-15 NOTE — Therapy (Addendum)
[facility] [address], [place], [place], [address] Phone: [phone]   Fax:  [phone]  Physical Therapy Treatment/Discharge Summary   Patient Details  Name: Heidi Nash MRN: [id number] Date of Birth: [birth year] Referring Provider (PT): Sela Hilding MD   Encounter Date: 09/15/2019  PT End of Session - 09/15/19 1354    Visit Number  6    Number of Visits  13    Date for PT Re-Evaluation  09/30/19    Authorization Type  UHC    PT Start Time  1217    PT Stop Time  1300    PT Time Calculation (min)  43 min    Activity Tolerance  Patient tolerated treatment well       Past Medical History:  Diagnosis Date  . Hypothyroidism   . S/P cervical polypectomy     Past Surgical History:  Procedure Laterality Date  . CERVICAL POLYPECTOMY      There were no vitals filed for this visit.  Subjective Assessment - 09/15/19 1219    Subjective  I'm the same.  Depends on the time of day.  At the end of the day I lie down with heat and that helps.  I massage that area with a tennis ball and that feels good.  Walking is fine.    Pertinent History  Osteoporoais, hypothyroidism, s/p cervical polysectomy    How long can you sit comfortably?  30 minutes    Currently in Pain?  No/denies    Pain Score  0-No pain         [facility] PT Assessment - 09/15/19 0001      Observation/Other Assessments   Focus on Therapeutic Outcomes (FOTO)   22% limitation       Strength   Right Hip Extension  4-/5    Right Hip ABduction  4-/5                   [facility] Adult PT Treatment/Exercise - 09/15/19 0001      Self-Care   Posture  discuss sitting limitations and HEP review for continued gluteal strengthening       Lumbar Exercises: Supine   Other Supine Lumbar Exercises  DKTC 10x     Other Supine Lumbar Exercises  neural flossing: ankle pumps 10x, knee flexion/extension on off 10x       Knee/Hip Exercises: Aerobic    Stationary Bike  standing 6 min while discussing progress forward and back       Knee/Hip Exercises: Standing   Other Standing Knee Exercises  repeated flexion in standing and extension in standing 10x       Knee/Hip Exercises: Prone   Other Prone Exercises  press ups with and without exhalation 2x 8              PT Education - 09/15/19 1356    Education Details  Access Code: [email or web address]   prone press up trial;  limit sitting;  supine nerve gliding    Person(s) Educated  Patient    Methods  Explanation;Demonstration;Handout    Comprehension  Returned demonstration;Verbalized understanding          PT Long Term Goals - 09/15/19 1404      PT LONG TERM GOAL #1   Title  Pt will be independent in her HEP and progression.    Time  6    Period  Weeks    Status  On-going    Target  Date  09/30/19      PT LONG TERM GOAL #2   Title  Pt will improve her FOTO score from 23% limitaiton to </=18 % limitation.    Time  6    Period  Weeks    Status  On-going      PT LONG TERM GOAL #3   Title  Pt will be able to improve her LE hip strength to grossly 5/5 in order to improve gait and function.    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    Time  6    Period  Weeks    Status  On-going            Plan - 09/15/19 1253    Clinical Impression Statement  Assessed lumbar spine with repeated movement testing to determine if buttock/hip symptoms could be referred from the lumbar region.  No clear directional preference but patient will do a home trial of prone press ups every 2 hours for the next 4-5 days.  Discussed sitting avoidance and seated sciatic tensioning avoidance until decreased sensitization.  Initiated supine neural gliding with mild symptoms produced but dissipates quickly.  Right hip extension and abduction weakness persists and we discussed  continuation of her current HEP for strengthening.  Therapist closely monitoring response with all treatment interventions and modifying as needed.    Comorbidities  osteoporosis, hypothyroidism    Examination-Participation Restrictions  Community Activity;Other    Rehab Potential  Good    PT Frequency  2x / week    PT Duration  6 weeks    PT Treatment/Interventions  Electrical Stimulation;Cryotherapy;Moist Heat;Traction;Functional mobility training;Stair training;Therapeutic activities;Therapeutic exercise;Balance training;Patient/family education;Manual techniques;Passive range of motion;Dry needling;Taping    PT Next Visit Plan  follow up in 1 week response to extension press up trial and supine nerve glides, sitting avoidance    PT Home Exercise Plan  [email or web address]       Patient will benefit from skilled therapeutic intervention in order to improve the following deficits and impairments:  Pain, Postural dysfunction, Impaired flexibility, Decreased strength  Visit Diagnosis: Pain in right hip  Difficulty in walking, not elsewhere classified  Muscle weakness (generalized)  PHYSICAL THERAPY DISCHARGE SUMMARY  Visits from Start of Care: 6  Current functional level related to goals / functional outcomes: The patient called to cancel her last scheduled appointment stating she was feeling better.    Remaining deficits: As above   Education / Equipment: HEP  Plan: Patient agrees to discharge.  Patient goals were partially met. Patient is being discharged due to the patient's request.  ?????         Problem List There are no active problems to display for this patient.  Ruben Im, PT 09/15/19 2:05 PM Phone: [phone] Fax: [phone] [name] 09/15/2019, 2:05 PM  [facility] [address], [place], [place], [address] Phone: [phone]   Fax:  [phone]  Name: Heidi Nash MRN:  [id number] Date of Birth: 05-Dec-1961

## 2019-09-15 NOTE — Patient Instructions (Signed)
Access Code: [email or web address]  URL: [email or web address]  Date: 09/15/2019  Prepared by: Ruben Im   Exercises  Supine Piriformis Stretch - 5 reps - 1 sets - 30-60 seconds hold - 3x daily - 7x weekly  Supine Hamstring Stretch with Strap - 5 reps - 1 sets - 30-60 seconds hold - 3x daily - 7x weekly  Standing Row with Anchored Resistance - 10 reps - 2 sets - 3-5 seconds hold - 3x daily - 7x weekly  Sidelying Hip Abduction - 10 reps - 2 sets - 2-3 seconds hold - 3x daily - 7x weekly  Supine Single Knee to Chest Stretch - 3 reps - 30 seconds hold - 3x daily - 7x weekly  Thoracic Extension Mobilization with Noodle - 10 reps - 3 sets - 1x daily - 7x weekly  Prone Hip Extension - One Pillow - 10 reps - 1 sets - 1x daily - 7x weekly  Single Leg Stance - 10 reps - 1 sets - 1x daily - 7x weekly  Bird Dog - 10 reps - 1 sets - 1x daily - 7x weekly  Clamshell - 10 reps - 1 sets - 1x daily - 7x weekly  Clamshell with Resistance - 10 reps - 1 sets - 1x daily - 7x weekly  Supine Transversus Abdominis Bracing with Leg Extension - 10 reps - 1 sets - 1x daily - 7x weekly  Supine 90/90 Shoulder Flexion with Abdominal Bracing - 10 reps - 1 sets - 1x daily - 7x weekly  Single Leg Stance Stabilization with Slow Side to Side Band Oscillations - Medial Anchor - 10 reps - 1 sets - 1x daily - 7x weekly  Prone Press Up - 10 reps - 1 sets - 8x daily - 7x weekly  Supine Sciatic Nerve Glide - 10 reps - 3 sets - 1x daily - 7x weekly  Supine Sciatic Nerve Glide - 10 reps - 1 sets - 1-2x daily - 7x weekly

## 2019-09-25 ENCOUNTER — Encounter: Payer: 59 | Admitting: Physical Therapy

## 2019-12-23 ENCOUNTER — Ambulatory Visit (INDEPENDENT_AMBULATORY_CARE_PROVIDER_SITE_OTHER): Payer: 59 | Admitting: Sports Medicine

## 2019-12-23 ENCOUNTER — Encounter: Payer: Self-pay | Admitting: Sports Medicine

## 2019-12-23 ENCOUNTER — Other Ambulatory Visit: Payer: Self-pay

## 2019-12-23 VITALS — BP 112/70 | Ht 63.5 in | Wt 124.0 lb

## 2019-12-23 DIAGNOSIS — G8929 Other chronic pain: Secondary | ICD-10-CM

## 2019-12-23 DIAGNOSIS — M545 Low back pain: Secondary | ICD-10-CM | POA: Diagnosis not present

## 2019-12-23 MED ORDER — MELOXICAM 15 MG PO TABS: 15.0000 mg | ORAL_TABLET | Freq: Every day | ORAL | 0 refills | Status: AC

## 2019-12-23 NOTE — Patient Instructions (Addendum)
We need to focus on strengthening your core muscles Please start the exercises that were provided to you today Please stop and get a x-ray sometime in the next 1- 2 days if anything is abnormal I will call you, otherwise we will discuss options at your follow-up appointment We have called in a prescription for meloxicam to use as needed for pain Please sleep with a hard pillow between your knees Consider over-the-counter IcyHot or salonpas patches on days your are sitting a lot Please follow up w/ me in 4-6 weeks

## 2019-12-23 NOTE — Progress Notes (Addendum)
   [facility] [address] [place], [place] [address] Phone: [phone] Fax: [phone]   Patient Name: Heidi Nash Date of Birth: Jul 11, 1961 Medical Record Number: [id number] Gender: female Date of Encounter: 12/23/2019  SUBJECTIVE:      Chief Complaint:  Right buttock pain   HPI:  Story is a pleasant 59 year old F presenting with 9 months of right buttock pain with intermittent radiating pain down the leg.  Was being treated by her PCP and had physical therapy last fall that helped the symptoms, but did not completely relieve.  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  Has been diligent about doing yoga and stretching for piriformis syndrome.  Alleviating factors include heat.  She denies any injury to the area. no swelling, weakness, or skin changes to the area.   ROS:     See HPI.   PERTINENT  PMH / PSH / FH / SH:  Past Medical, Surgical, Social, and Family History Reviewed & Updated in the EMR. Pertinent findings include:  Osteoporosis, hypothyroidism   OBJECTIVE:  BP 112/70   Ht 5' 3.5" (1.613 m)   Wt 124 lb (56.2 kg)   BMI 21.62 kg/m  Physical Exam:  Vital signs are reviewed.   GEN: Alert and oriented, NAD Pulm: Breathing unlabored PSY: normal mood, congruent affect  MSK: Back Exam:  Inspection: Unremarkable  Motion: Flexion 45 deg, Extension 45 deg, Side Bending to 45 deg bilaterally,  Rotation to 45 deg bilaterally  SLR laying: Negative  XSLR laying: Negative  Palpable tenderness: External rotators just lateral to right sacral border FABER: negative. Sensory change: Gross sensation intact to all lumbar and sacral dermatomes.  Reflexes: 2+ at both patellar tendons, 2+ at achilles tendons, Babinski's downgoing.  Strength at Feet  Plantar-flexion: 5/5 Dorsi-flexion: 5/5 Eversion: 5/5 Inversion: 5/5  Leg strength  Quad: 5/5 Hamstring: 5/5 Hip flexor: 4/5 Hip abductors: 5/5  Gait  unremarkable.   ASSESSMENT & PLAN:   1. Chronic right buttock pain with intermittent radiculopathy  Given that patient has failed conservative management, including medications and therapy I will order XR of low back in anticipation that we may need a MRI in the future.  For the next 4 weeks, I have given her a core strengthening home exercise program with meloxicam to use as needed.  She will follow-up with me at that time at which we can consider a MRI of the low back.   Lanier Clam, DO, ATC Sports Medicine Fellow  Addendum:  I was the preceptor for this visit and available for immediate consultation.  Karlton Lemon MD Kirt Boys

## 2019-12-25 ENCOUNTER — Ambulatory Visit
Admission: RE | Admit: 2019-12-25 | Discharge: 2019-12-25 | Disposition: A | Discharge status: Home / Self Care | Payer: 59 | Source: Ambulatory Visit | Attending: Sports Medicine | Admitting: Sports Medicine

## 2019-12-25 ENCOUNTER — Ambulatory Visit
Admission: RE | Admit: 2019-12-25 | Discharge: 2019-12-25 | Disposition: A | Discharge status: Home / Self Care | Payer: 59 | Source: Ambulatory Visit | Attending: Gastroenterology | Admitting: Gastroenterology

## 2019-12-25 ENCOUNTER — Other Ambulatory Visit: Payer: Self-pay

## 2019-12-25 DIAGNOSIS — G8929 Other chronic pain: Secondary | ICD-10-CM

## 2019-12-25 DIAGNOSIS — Z1231 Encounter for screening mammogram for malignant neoplasm of breast: Secondary | ICD-10-CM

## 2019-12-25 DIAGNOSIS — M545 Low back pain, unspecified: Secondary | ICD-10-CM

## 2020-01-20 ENCOUNTER — Encounter: Payer: Self-pay | Admitting: Sports Medicine

## 2020-01-20 ENCOUNTER — Ambulatory Visit: Payer: 59 | Admitting: Sports Medicine

## 2020-01-20 ENCOUNTER — Other Ambulatory Visit: Payer: Self-pay

## 2020-01-20 VITALS — BP 102/68

## 2020-01-20 DIAGNOSIS — G8929 Other chronic pain: Secondary | ICD-10-CM | POA: Diagnosis not present

## 2020-01-20 DIAGNOSIS — M545 Low back pain: Secondary | ICD-10-CM

## 2020-01-20 NOTE — Progress Notes (Addendum)
   [facility] [address] [place], [place] [address] Phone: [phone] Fax: [phone]   Patient Name: Heidi Nash Date of Birth: August 11, 1961 Medical Record Number: [id number] Gender: female Date of Encounter: 01/20/2020  SUBJECTIVE:      Chief Complaint:  Low back pain   HPI:  Heidi Nash is following up for chronic low back pain.  She has been doing her home exercise program and using meloxicam as needed, and has noticed a little bit of improvement.  She occasionally will get right-sided sciatica symptoms.  Is able to perform her daily activities and is not having numbness or tingling into her toes.  She enjoys walking.  She denies any new injury, swelling, erythema, or skin changes.  No saddle anesthesia or difficulty with bowel movements.     ROS:     See HPI.   PERTINENT  PMH / PSH / FH / SH:  Past Medical, Surgical, Social, and Family History Reviewed & Updated in the EMR. Pertinent findings include:  Osteoporosis on Fosamax   OBJECTIVE:  BP 102/68  Physical Exam:  Vital signs are reviewed.   GEN: Alert and oriented, NAD Pulm: Breathing unlabored PSY: normal mood, congruent affect  MSK: Back Exam:  Inspection: Unremarkable  Motion: Flexion 45 deg, Extension 45 deg, Side Bending to 45 deg bilaterally,  Rotation to 45 deg bilaterally  SLR laying: Negative  XSLR laying: Negative  Palpable tenderness: Right PSIS FABER: negative. Sensory change: Gross sensation intact to all lumbar and sacral dermatomes.  Reflexes: 2+ at both patellar tendons, 2+ at achilles tendons, Babinski's downgoing.  Strength at Feet  Plantar-flexion: 5/5 Dorsi-flexion: 5/5 Eversion: 5/5 Inversion: 5/5  Leg strength  Quad: 5/5 Hamstring: 5/5 Hip flexor: 4/5 Hip abductors: 5/5  Gait unremarkable.   ASSESSMENT & PLAN:   1. Chronic low back pain  Given that she has not had as much relief with a home exercise program, we will trial formal physical therapy in  the hopes of the combination of exercises and modalities use can relieve some of her symptoms.  I went over the x-ray with her in detail explaning to her the osteoarthritic changes.  I will see her back in 1 month at which time we can consider a local CSI versus obtaining an MRI for future back injections.   Heidi Clam, DO, ATC Sports Medicine Fellow  Addendum:  I was the preceptor for this visit and available for immediate consultation.  Heidi Lemon MD Heidi Nash

## 2020-02-17 ENCOUNTER — Ambulatory Visit: Payer: 59 | Admitting: Sports Medicine

## 2020-04-29 ENCOUNTER — Other Ambulatory Visit: Payer: Self-pay | Admitting: Family Medicine

## 2020-04-29 ENCOUNTER — Other Ambulatory Visit (HOSPITAL_COMMUNITY)
Admission: RE | Admit: 2020-04-29 | Discharge: 2020-04-29 | Disposition: A | Discharge status: Home / Self Care | Payer: 59 | Source: Ambulatory Visit | Attending: Family Medicine | Admitting: Family Medicine

## 2020-04-29 DIAGNOSIS — Z01411 Encounter for gynecological examination (general) (routine) with abnormal findings: Secondary | ICD-10-CM | POA: Diagnosis present

## 2020-05-03 LAB — CYTOLOGY - PAP
Comment: NEGATIVE
Diagnosis: NEGATIVE
High risk HPV: NEGATIVE

## 2021-01-30 ENCOUNTER — Other Ambulatory Visit: Payer: Self-pay | Admitting: Family Medicine

## 2021-01-30 DIAGNOSIS — Z1231 Encounter for screening mammogram for malignant neoplasm of breast: Secondary | ICD-10-CM

## 2021-03-20 ENCOUNTER — Ambulatory Visit
Admission: RE | Admit: 2021-03-20 | Discharge: 2021-03-20 | Disposition: A | Discharge status: Home / Self Care | Payer: 59 | Source: Ambulatory Visit | Attending: Family Medicine | Admitting: Family Medicine

## 2021-03-20 ENCOUNTER — Other Ambulatory Visit: Payer: Self-pay

## 2021-03-20 DIAGNOSIS — Z1231 Encounter for screening mammogram for malignant neoplasm of breast: Secondary | ICD-10-CM

## 2021-05-17 ENCOUNTER — Other Ambulatory Visit: Payer: Self-pay | Admitting: Family Medicine

## 2021-05-17 DIAGNOSIS — M810 Age-related osteoporosis without current pathological fracture: Secondary | ICD-10-CM

## 2021-07-04 ENCOUNTER — Other Ambulatory Visit: Payer: 59

## 2022-01-09 ENCOUNTER — Ambulatory Visit
Admission: RE | Admit: 2022-01-09 | Discharge: 2022-01-09 | Disposition: A | Discharge status: Home / Self Care | Payer: 59 | Source: Ambulatory Visit | Attending: Family Medicine | Admitting: Family Medicine

## 2022-01-09 DIAGNOSIS — M810 Age-related osteoporosis without current pathological fracture: Secondary | ICD-10-CM

## 2022-04-25 ENCOUNTER — Other Ambulatory Visit: Payer: Self-pay | Admitting: Family Medicine

## 2022-04-25 DIAGNOSIS — Z1231 Encounter for screening mammogram for malignant neoplasm of breast: Secondary | ICD-10-CM

## 2022-05-23 ENCOUNTER — Ambulatory Visit
Admission: RE | Admit: 2022-05-23 | Discharge: 2022-05-23 | Disposition: A | Discharge status: Home / Self Care | Payer: 59 | Source: Ambulatory Visit | Attending: Family Medicine | Admitting: Family Medicine

## 2022-05-23 DIAGNOSIS — Z1231 Encounter for screening mammogram for malignant neoplasm of breast: Secondary | ICD-10-CM

## 2023-02-25 DIAGNOSIS — J019 Acute sinusitis, unspecified: Secondary | ICD-10-CM | POA: Diagnosis not present

## 2023-05-29 ENCOUNTER — Ambulatory Visit
Admission: RE | Admit: 2023-05-29 | Discharge: 2023-05-29 | Disposition: A | Discharge status: Home / Self Care | Payer: BLUE CROSS/BLUE SHIELD | Source: Ambulatory Visit | Attending: Family Medicine | Admitting: Family Medicine

## 2023-05-29 ENCOUNTER — Other Ambulatory Visit: Payer: Self-pay | Admitting: Family Medicine

## 2023-05-29 DIAGNOSIS — E559 Vitamin D deficiency, unspecified: Secondary | ICD-10-CM | POA: Diagnosis not present

## 2023-05-29 DIAGNOSIS — E039 Hypothyroidism, unspecified: Secondary | ICD-10-CM | POA: Diagnosis not present

## 2023-05-29 DIAGNOSIS — Z79899 Other long term (current) drug therapy: Secondary | ICD-10-CM | POA: Diagnosis not present

## 2023-05-29 DIAGNOSIS — Z1231 Encounter for screening mammogram for malignant neoplasm of breast: Secondary | ICD-10-CM

## 2023-06-06 DIAGNOSIS — M810 Age-related osteoporosis without current pathological fracture: Secondary | ICD-10-CM | POA: Diagnosis not present

## 2023-06-06 DIAGNOSIS — E039 Hypothyroidism, unspecified: Secondary | ICD-10-CM | POA: Diagnosis not present

## 2023-06-06 DIAGNOSIS — Z0000 Encounter for general adult medical examination without abnormal findings: Secondary | ICD-10-CM | POA: Diagnosis not present

## 2023-06-06 DIAGNOSIS — F329 Major depressive disorder, single episode, unspecified: Secondary | ICD-10-CM | POA: Diagnosis not present

## 2023-06-06 DIAGNOSIS — E559 Vitamin D deficiency, unspecified: Secondary | ICD-10-CM | POA: Diagnosis not present

## 2023-07-22 DIAGNOSIS — Z1211 Encounter for screening for malignant neoplasm of colon: Secondary | ICD-10-CM | POA: Diagnosis not present

## 2023-07-31 DIAGNOSIS — M7711 Lateral epicondylitis, right elbow: Secondary | ICD-10-CM | POA: Diagnosis not present

## 2023-07-31 DIAGNOSIS — M1812 Unilateral primary osteoarthritis of first carpometacarpal joint, left hand: Secondary | ICD-10-CM | POA: Diagnosis not present

## 2023-09-25 DIAGNOSIS — B001 Herpesviral vesicular dermatitis: Secondary | ICD-10-CM | POA: Diagnosis not present

## 2023-09-25 DIAGNOSIS — L249 Irritant contact dermatitis, unspecified cause: Secondary | ICD-10-CM | POA: Diagnosis not present

## 2023-09-25 DIAGNOSIS — H61001 Unspecified perichondritis of right external ear: Secondary | ICD-10-CM | POA: Diagnosis not present

## 2023-09-25 DIAGNOSIS — Z85828 Personal history of other malignant neoplasm of skin: Secondary | ICD-10-CM | POA: Diagnosis not present

## 2024-02-10 DIAGNOSIS — J189 Pneumonia, unspecified organism: Secondary | ICD-10-CM | POA: Diagnosis not present

## 2024-02-10 DIAGNOSIS — R52 Pain, unspecified: Secondary | ICD-10-CM | POA: Diagnosis not present

## 2024-02-10 DIAGNOSIS — R5383 Other fatigue: Secondary | ICD-10-CM | POA: Diagnosis not present

## 2024-02-10 DIAGNOSIS — R051 Acute cough: Secondary | ICD-10-CM | POA: Diagnosis not present

## 2024-02-10 DIAGNOSIS — R6883 Chills (without fever): Secondary | ICD-10-CM | POA: Diagnosis not present

## 2024-06-01 ENCOUNTER — Other Ambulatory Visit (HOSPITAL_BASED_OUTPATIENT_CLINIC_OR_DEPARTMENT_OTHER): Payer: Self-pay | Admitting: Family Medicine

## 2024-06-01 DIAGNOSIS — M810 Age-related osteoporosis without current pathological fracture: Secondary | ICD-10-CM

## 2024-06-04 IMAGING — MG MM DIGITAL SCREENING BILAT W/ TOMO AND CAD
6 of 10 series · 6 of 30 positions shown · non-contrast
Comparison: Previous exam(s).

CLINICAL DATA: Screening.

EXAM:
DIGITAL SCREENING BILATERAL MAMMOGRAM WITH TOMOSYNTHESIS AND CAD
TECHNIQUE: Bilateral screening digital craniocaudal and mediolateral oblique
mammograms were obtained. Bilateral screening digital breast
tomosynthesis was performed. The images were evaluated with
computer-aided detection.

[L CC synth-2D]
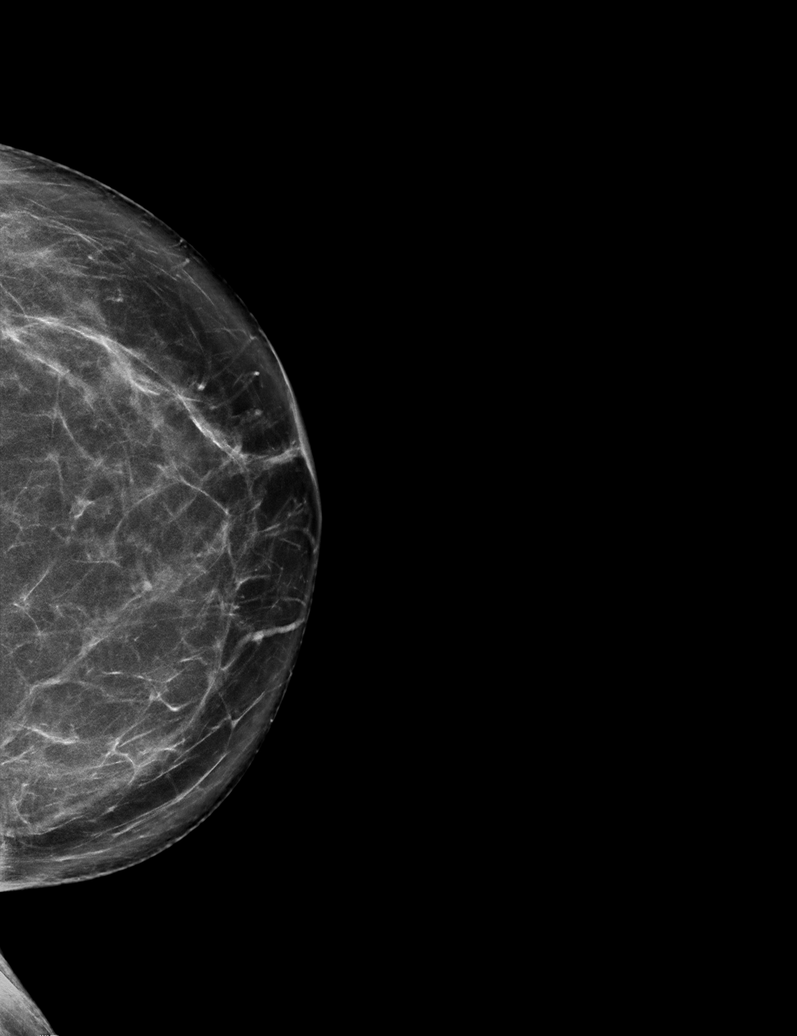

[L MLO synth-2D (1 of 2)]
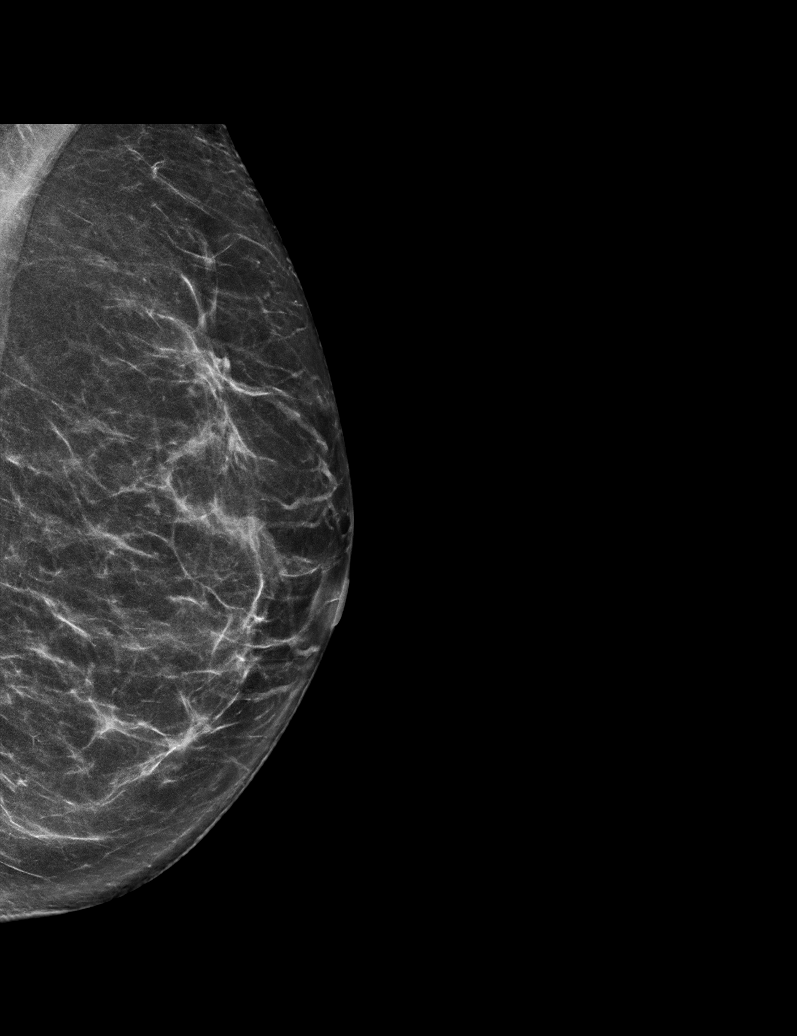

[L MLO synth-2D (2 of 2)]
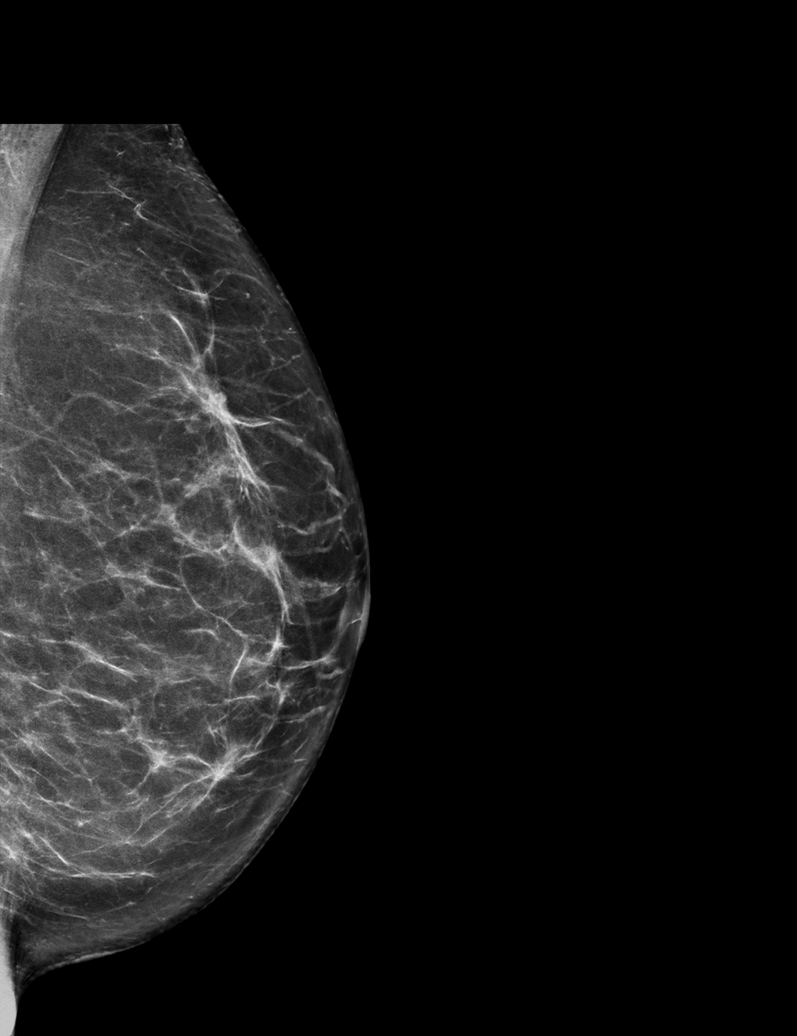

[R CC synth-2D]
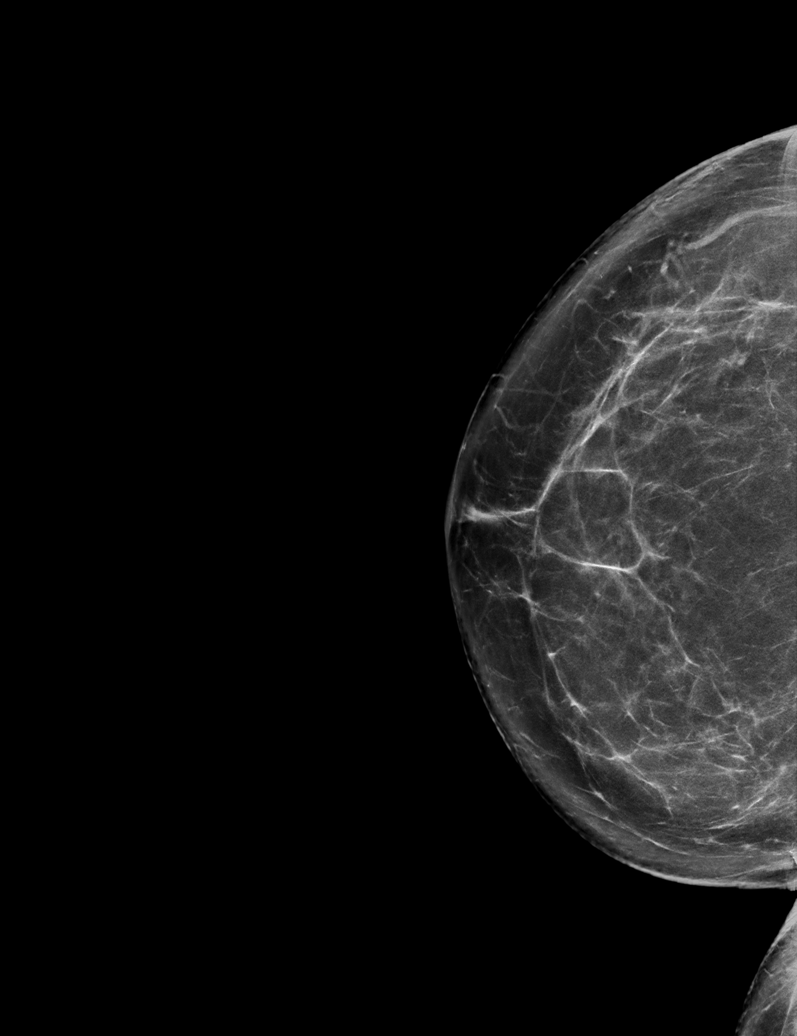

[R MLO synth-2D]
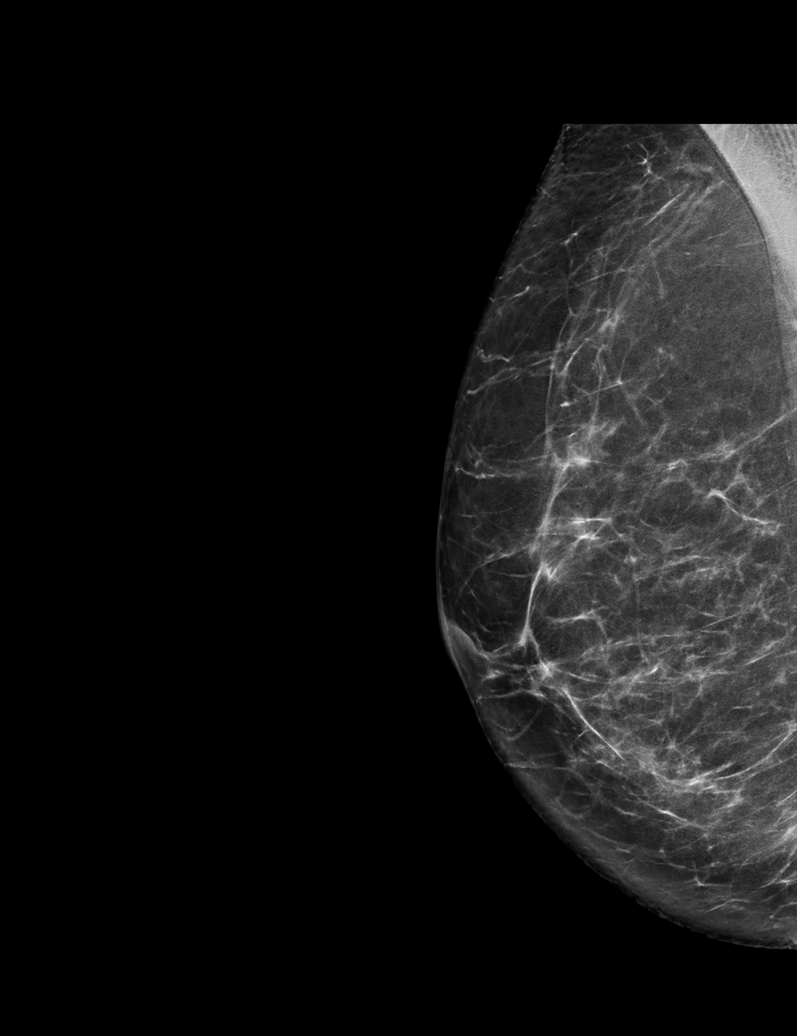

[L MLO tomo · tomo slice 34/67.0]
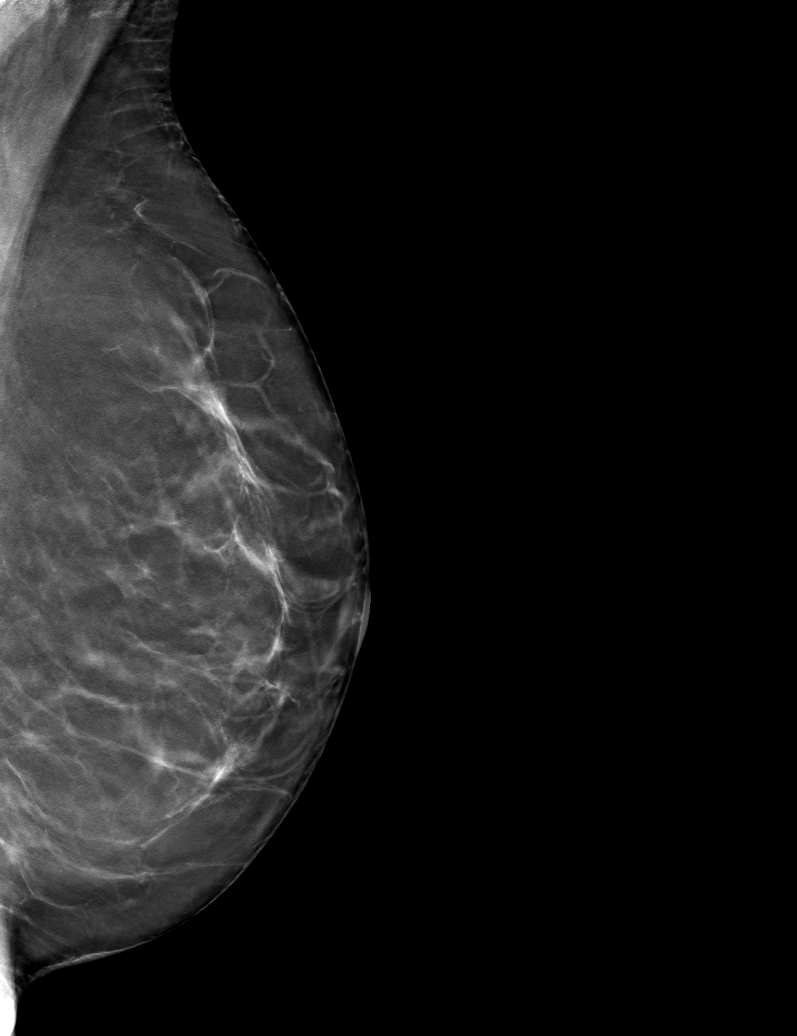

[6 of 30 positions shown; findings below may reference images not displayed]

ACR Breast Density Category b: There are scattered areas of
fibroglandular density.
FINDINGS: There are no findings suspicious for malignancy.
IMPRESSION: No mammographic evidence of malignancy. A result letter of this
screening mammogram will be mailed directly to the patient.

RECOMMENDATION:
Screening mammogram in one year. (Code:51-O-LD2)

BI-RADS CATEGORY  1: Negative.

## 2024-06-18 ENCOUNTER — Other Ambulatory Visit: Payer: Self-pay | Admitting: Family Medicine

## 2024-06-18 DIAGNOSIS — Z1231 Encounter for screening mammogram for malignant neoplasm of breast: Secondary | ICD-10-CM

## 2024-07-01 DIAGNOSIS — D485 Neoplasm of uncertain behavior of skin: Secondary | ICD-10-CM | POA: Diagnosis not present

## 2024-07-01 DIAGNOSIS — L570 Actinic keratosis: Secondary | ICD-10-CM | POA: Diagnosis not present

## 2024-07-01 DIAGNOSIS — L986 Other infiltrative disorders of the skin and subcutaneous tissue: Secondary | ICD-10-CM | POA: Diagnosis not present

## 2024-07-03 ENCOUNTER — Ambulatory Visit
Admission: RE | Admit: 2024-07-03 | Discharge: 2024-07-03 | Disposition: A | Discharge status: Home / Self Care | Payer: Self-pay | Source: Ambulatory Visit | Attending: Family Medicine | Admitting: Family Medicine

## 2024-07-03 DIAGNOSIS — Z1231 Encounter for screening mammogram for malignant neoplasm of breast: Secondary | ICD-10-CM | POA: Diagnosis not present

## 2024-07-06 DIAGNOSIS — D649 Anemia, unspecified: Secondary | ICD-10-CM | POA: Diagnosis not present

## 2024-07-06 DIAGNOSIS — Z79899 Other long term (current) drug therapy: Secondary | ICD-10-CM | POA: Diagnosis not present

## 2024-07-06 DIAGNOSIS — R5383 Other fatigue: Secondary | ICD-10-CM | POA: Diagnosis not present

## 2024-07-06 DIAGNOSIS — E039 Hypothyroidism, unspecified: Secondary | ICD-10-CM | POA: Diagnosis not present

## 2024-07-06 DIAGNOSIS — Z23 Encounter for immunization: Secondary | ICD-10-CM | POA: Diagnosis not present

## 2024-07-06 DIAGNOSIS — Z13220 Encounter for screening for lipoid disorders: Secondary | ICD-10-CM | POA: Diagnosis not present

## 2024-07-06 DIAGNOSIS — E559 Vitamin D deficiency, unspecified: Secondary | ICD-10-CM | POA: Diagnosis not present

## 2024-07-06 DIAGNOSIS — M810 Age-related osteoporosis without current pathological fracture: Secondary | ICD-10-CM | POA: Diagnosis not present

## 2024-07-06 DIAGNOSIS — Z0000 Encounter for general adult medical examination without abnormal findings: Secondary | ICD-10-CM | POA: Diagnosis not present

## 2024-07-20 ENCOUNTER — Other Ambulatory Visit: Payer: Self-pay

## 2024-07-20 ENCOUNTER — Ambulatory Visit: Attending: Family Medicine | Admitting: Rehabilitation

## 2024-07-20 ENCOUNTER — Encounter: Payer: Self-pay | Admitting: Rehabilitation

## 2024-07-20 DIAGNOSIS — M810 Age-related osteoporosis without current pathological fracture: Secondary | ICD-10-CM | POA: Insufficient documentation

## 2024-07-20 DIAGNOSIS — M6281 Muscle weakness (generalized): Secondary | ICD-10-CM | POA: Diagnosis not present

## 2024-07-20 NOTE — Therapy (Signed)
 OUTPATIENT PHYSICAL THERAPY THORACOLUMBAR EVALUATION   Patient Name: Heidi Nash MRN: [id number] DOB:07-20-1961, 63 y.o., female Today's Date: 07/20/2024  END OF SESSION:  PT End of Session - 07/20/24 1358     Visit Number 1    Number of Visits 4    Date for PT Re-Evaluation 08/31/24    Authorization Type none needed    PT Start Time 1204    PT Stop Time 1300    PT Time Calculation (min) 56 min    Activity Tolerance Patient tolerated treatment well    Behavior During Therapy WFL for tasks assessed/performed          Past Medical History:  Diagnosis Date   Hypothyroidism    S/P cervical polypectomy    Past Surgical History:  Procedure Laterality Date   CERVICAL POLYPECTOMY     There are no active problems to display for this patient.   REFERRING PROVIDER: Lamarr Rotunda, MD  REFERRING DIAG:  Diagnosis  M81.0 (ICD-10-CM) - Age-related osteoporosis without current pathological fracture   Rationale for Evaluation and Treatment: Rehabilitation  THERAPY DIAG:  Muscle weakness (generalized)  Osteoporosis without current pathological fracture, unspecified osteoporosis type  ONSET DATE: 2014  SUBJECTIVE:                                                                                                                                                                                           SUBJECTIVE STATEMENT: I do walking and yoga.  I know about about the hip hinge.  Balance feels good overall.  I do Home videos about osteoporosis but I want to make sure that I am doing what I need to do. 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    PERTINENT HISTORY:  Osteoporosis.  No fracture history.   T-score AP Spine  L1-L3            01/09/2022    61.3         -2.8    0.832 g/cm2   DualFemur Neck Right 01/09/2022    61.3         -2.6    0.677 g/cm2   DualFemur Total Mean 01/09/2022     61.3         -1.7    0.799 g/cm2  PAIN:  Are you having pain? No  PRECAUTIONS: none   RED FLAGS: None   WEIGHT BEARING RESTRICTIONS: No  FALLS:  Has patient fallen in last 6 months? No  LIVING ENVIRONMENT: Lives with: lives with their family  and lives with their spouse  OCCUPATION: part time Engineer, agricultural    PLOF: Independent  PATIENT GOALS: figure out how to stay strong, get stronger   NEXT MD VISIT: as needed  OBJECTIVE:  Note: Objective measures were completed at Evaluation unless otherwise noted.  COGNITION: Overall cognitive status: Within functional limits for tasks assessed     SENSATION: Not tested  MUSCLE LENGTH: WNL  POSTURE: increased thoracic kyphosis  PALPATION: WNL  LOWER EXTREMITY MMT:    MMT Right eval Left eval  Hip flexion 4+ 4- due to knee pain  Hip extension 4 4  Hip abduction 4 4  Hip adduction    Hip internal rotation 5 5  Hip external rotation 5 5  Knee flexion 5 5  Knee extension 5 5  Ankle dorsiflexion    Ankle plantarflexion    Ankle inversion    Ankle eversion     (Blank rows = not tested)  UPPER EXTREMITY MMT:  MMT Right eval Left eval  Shoulder flexion 4+ 4+  Shoulder extension    Shoulder abduction 4+ 4+  Shoulder adduction    Shoulder extension 5 5  Shoulder internal rotation 5 5  Shoulder external rotation 5 5  Middle trapezius    Lower trapezius    Elbow flexion 5 5  Elbow extension 5 5  Wrist flexion    Wrist extension    Wrist ulnar deviation    Wrist radial deviation    Wrist pronation    Wrist supination    Grip strength     (Blank rows = not tested)   FUNCTIONAL TESTS:  Sit to stand WNL  GAIT: WNL  TREATMENT DATE:  07/20/24 Eval performed Discussed current exercise routine and knowledge about osteoporosis and exercise.  She has a routine of the daily 5 which is heel drops, tree pose, locust pose, sit to stand, and bridging and has a couple other videos that she does with sometimes 3 or  5#s.  These are a good mix of core, UE, and LE that are listed in the HEP section.  We discussed using a weighted vest, weighting using ankle and wrist weights, and how she is doing very well except for missing some of the of the PRE part of strengthening.  Discussed 3 sets of 8-10 and how the last one should feel like you are happy to stop and could maybe do 2 more.  And how to increase weight.                                                                                   PATIENT EDUCATION:  Education details: per above Person educated: Patient Education method: Programmer, multimedia, Facilities manager, and Handouts Education comprehension: verbalized understanding  HOME EXERCISE PROGRAM: Sit to stand, bridge, heel drops, locust, tree pose , plank, single arm row, bird dog and dead bug  3 sets of 8-10  ASSESSMENT:  CLINICAL IMPRESSION: Patient is a 63 y.o. female who was seen today for physical therapy evaluation and treatment for her 10 year hx of osteoporosis without fracture.  She is feeling lately like she is not strong and wondering if she is doing what is correct for  her HEP.  She exercises at home with 3# or 5# weights.  She walks and also does yoga.  Her balance is not a concern.  The main thing she is missing is progressing weights and dosing them correctly to get the strength to increase vs maintain.  She has a great knowledge of exercises to do but has too many choices.  Narrowing down the importance concepts of pushing exercises, pulling exercises, core, legs, and balance.  Pt will benefit from 2-3 visits to make sure she is doing her exercises and form correctly and to get an idea of weight choice.   OBJECTIVE IMPAIRMENTS: decreased knowledge of condition and decreased strength.   ACTIVITY LIMITATIONS: lifting  PARTICIPATION LIMITATIONS: none  PERSONAL FACTORS: None   REHAB POTENTIAL: Excellent  CLINICAL DECISION MAKING: Stable/uncomplicated  EVALUATION COMPLEXITY:  Low   GOALS: Goals reviewed with patient? Yes  SHORT TERM GOALS = LTGs: Target date: 08/31/24  Pt will be ind with final HEP for osteoporosis management  Baseline: Goal status: INITIAL  2.  Pt will understand Progressive resistance needed for strength improvements Baseline:  Goal status: INITIAL   PLAN:  PT FREQUENCY: 1x/week  PT DURATION: 6 weeks  PLANNED INTERVENTIONS: 97110-Therapeutic exercises, 97530- Therapeutic activity, [id number]- Neuromuscular re-education, and [id number]- Self Care.  PLAN FOR NEXT SESSION: perform osteoporosis friendly resistance training like per above in HEP - pt would like core to feel stronger for lifting her grandchild   Larue Saddie SAUNDERS, PT 07/20/2024, 2:00 PM

## 2024-08-03 DIAGNOSIS — Z1211 Encounter for screening for malignant neoplasm of colon: Secondary | ICD-10-CM | POA: Diagnosis not present

## 2024-08-05 ENCOUNTER — Ambulatory Visit: Admitting: Rehabilitation

## 2024-08-06 DIAGNOSIS — L570 Actinic keratosis: Secondary | ICD-10-CM | POA: Diagnosis not present

## 2024-08-06 DIAGNOSIS — L810 Postinflammatory hyperpigmentation: Secondary | ICD-10-CM | POA: Diagnosis not present

## 2024-10-02 DIAGNOSIS — L570 Actinic keratosis: Secondary | ICD-10-CM | POA: Diagnosis not present

## 2024-10-02 DIAGNOSIS — L578 Other skin changes due to chronic exposure to nonionizing radiation: Secondary | ICD-10-CM | POA: Diagnosis not present

## 2024-10-02 DIAGNOSIS — D229 Melanocytic nevi, unspecified: Secondary | ICD-10-CM | POA: Diagnosis not present

## 2024-10-02 DIAGNOSIS — L821 Other seborrheic keratosis: Secondary | ICD-10-CM | POA: Diagnosis not present

## 2024-10-02 DIAGNOSIS — L814 Other melanin hyperpigmentation: Secondary | ICD-10-CM | POA: Diagnosis not present

## 2024-10-14 DIAGNOSIS — Z23 Encounter for immunization: Secondary | ICD-10-CM | POA: Diagnosis not present

## 2024-10-30 DIAGNOSIS — Z23 Encounter for immunization: Secondary | ICD-10-CM | POA: Diagnosis not present

## 2025-02-01 ENCOUNTER — Other Ambulatory Visit (HOSPITAL_BASED_OUTPATIENT_CLINIC_OR_DEPARTMENT_OTHER): Payer: Self-pay
